# Patient Record
Sex: Male | Born: 1955 | Race: White | Hispanic: No | Marital: Married | Smoking: Former smoker
Health system: Southern US, Community
[De-identification: ages and names within clinical notes are randomized; demographics above are authoritative.]

## PROBLEM LIST (undated history)

## (undated) DIAGNOSIS — Z86010 Personal history of colonic polyps: Principal | ICD-10-CM

## (undated) DIAGNOSIS — B353 Tinea pedis: Secondary | ICD-10-CM

## (undated) DIAGNOSIS — M5117 Intervertebral disc disorders with radiculopathy, lumbosacral region: Secondary | ICD-10-CM

## (undated) DIAGNOSIS — E782 Mixed hyperlipidemia: Secondary | ICD-10-CM

## (undated) DIAGNOSIS — S335XXA Sprain of ligaments of lumbar spine, initial encounter: Secondary | ICD-10-CM

## (undated) DIAGNOSIS — R011 Cardiac murmur, unspecified: Secondary | ICD-10-CM

## (undated) DIAGNOSIS — M5137 Other intervertebral disc degeneration, lumbosacral region: Secondary | ICD-10-CM

## (undated) DIAGNOSIS — M51379 Other intervertebral disc degeneration, lumbosacral region without mention of lumbar back pain or lower extremity pain: Secondary | ICD-10-CM

## (undated) DIAGNOSIS — J301 Allergic rhinitis due to pollen: Secondary | ICD-10-CM

## (undated) DIAGNOSIS — G8929 Other chronic pain: Secondary | ICD-10-CM

## (undated) DIAGNOSIS — G894 Chronic pain syndrome: Secondary | ICD-10-CM

## (undated) DIAGNOSIS — D126 Benign neoplasm of colon, unspecified: Secondary | ICD-10-CM

## (undated) DIAGNOSIS — M5416 Radiculopathy, lumbar region: Secondary | ICD-10-CM

## (undated) DIAGNOSIS — R05 Cough: Secondary | ICD-10-CM

## (undated) DIAGNOSIS — N281 Cyst of kidney, acquired: Secondary | ICD-10-CM

## (undated) DIAGNOSIS — E119 Type 2 diabetes mellitus without complications: Secondary | ICD-10-CM

## (undated) DIAGNOSIS — Z0000 Encounter for general adult medical examination without abnormal findings: Secondary | ICD-10-CM

## (undated) DIAGNOSIS — G4730 Sleep apnea, unspecified: Secondary | ICD-10-CM

## (undated) DIAGNOSIS — I4510 Unspecified right bundle-branch block: Secondary | ICD-10-CM

## (undated) DIAGNOSIS — M79621 Pain in right upper arm: Secondary | ICD-10-CM

## (undated) DIAGNOSIS — K5730 Diverticulosis of large intestine without perforation or abscess without bleeding: Secondary | ICD-10-CM

## (undated) DIAGNOSIS — I517 Cardiomegaly: Secondary | ICD-10-CM

## (undated) HISTORY — DX: Cardiomegaly: I51.7

## (undated) HISTORY — DX: Sleep apnea, unspecified: G47.30

## (undated) HISTORY — DX: Cough: R05

## (undated) HISTORY — DX: Encounter for general adult medical examination without abnormal findings: Z00.00

## (undated) HISTORY — DX: Cardiac murmur, unspecified: R01.1

## (undated) HISTORY — DX: Benign neoplasm of colon, unspecified: D12.6

## (undated) HISTORY — DX: Hypocalcemia: E83.51

## (undated) HISTORY — DX: Unspecified right bundle-branch block: I45.10

## (undated) HISTORY — DX: Pain in right upper arm: M79.621

## (undated) HISTORY — DX: Cyst of kidney, acquired: N28.1

## (undated) HISTORY — DX: Tinea pedis: B35.3

## (undated) HISTORY — DX: Mixed hyperlipidemia: E78.2

## (undated) HISTORY — DX: Sprain of ligaments of lumbar spine, initial encounter: S33.5XXA

## (undated) HISTORY — DX: Allergic rhinitis due to pollen: J30.1

## (undated) HISTORY — DX: Radiculopathy, lumbar region: M54.16

## (undated) HISTORY — DX: Intervertebral disc disorders with radiculopathy, lumbosacral region: M51.17

## (undated) HISTORY — PX: OTHER SURGICAL HISTORY: SHX169

## (undated) HISTORY — DX: Chronic pain syndrome: G89.4

## (undated) HISTORY — DX: Other chronic pain: G89.29

## (undated) HISTORY — DX: Type 2 diabetes mellitus without complications: E11.9

## (undated) HISTORY — DX: Diverticulosis of large intestine without perforation or abscess without bleeding: K57.30

## (undated) HISTORY — DX: Other intervertebral disc degeneration, lumbosacral region without mention of lumbar back pain or lower extremity pain: M51.379

## (undated) HISTORY — DX: Morbid (severe) obesity due to excess calories: E66.01

## (undated) HISTORY — PX: TONSILLECTOMY: SHX5217

## (undated) HISTORY — PX: CHOLECYSTECTOMY: SHX55

## (undated) HISTORY — DX: Personal history of colonic polyps: Z86.010

## (undated) HISTORY — DX: Other intervertebral disc degeneration, lumbosacral region: M51.37

---

## 2006-12-21 ENCOUNTER — Encounter: Payer: Self-pay | Admitting: Family Medicine

## 2006-12-21 DIAGNOSIS — Z860100 Personal history of colon polyps, unspecified: Secondary | ICD-10-CM

## 2006-12-21 HISTORY — DX: Personal history of colon polyps, unspecified: Z86.0100

## 2006-12-21 HISTORY — DX: Personal history of colonic polyps: Z86.010

## 2008-02-02 ENCOUNTER — Encounter
Admission: RE | Admit: 2008-02-02 | Discharge: 2008-02-02 | Payer: Self-pay | Admitting: Physical Medicine and Rehabilitation

## 2008-11-20 LAB — HM COLONOSCOPY

## 2011-01-25 ENCOUNTER — Encounter: Payer: Self-pay | Admitting: Family Medicine

## 2011-01-25 ENCOUNTER — Ambulatory Visit (INDEPENDENT_AMBULATORY_CARE_PROVIDER_SITE_OTHER): Payer: BC Managed Care – PPO | Admitting: Family Medicine

## 2011-01-25 DIAGNOSIS — E669 Obesity, unspecified: Secondary | ICD-10-CM

## 2011-01-25 DIAGNOSIS — G4730 Sleep apnea, unspecified: Secondary | ICD-10-CM

## 2011-01-25 DIAGNOSIS — J301 Allergic rhinitis due to pollen: Secondary | ICD-10-CM | POA: Insufficient documentation

## 2011-01-25 DIAGNOSIS — G8929 Other chronic pain: Secondary | ICD-10-CM | POA: Insufficient documentation

## 2011-01-25 DIAGNOSIS — E782 Mixed hyperlipidemia: Secondary | ICD-10-CM

## 2011-01-25 DIAGNOSIS — I4510 Unspecified right bundle-branch block: Secondary | ICD-10-CM

## 2011-01-25 DIAGNOSIS — E119 Type 2 diabetes mellitus without complications: Secondary | ICD-10-CM

## 2011-01-25 DIAGNOSIS — G4733 Obstructive sleep apnea (adult) (pediatric): Secondary | ICD-10-CM | POA: Insufficient documentation

## 2011-01-25 DIAGNOSIS — K5730 Diverticulosis of large intestine without perforation or abscess without bleeding: Secondary | ICD-10-CM | POA: Insufficient documentation

## 2011-01-25 DIAGNOSIS — S335XXA Sprain of ligaments of lumbar spine, initial encounter: Secondary | ICD-10-CM

## 2011-01-25 DIAGNOSIS — D126 Benign neoplasm of colon, unspecified: Secondary | ICD-10-CM | POA: Insufficient documentation

## 2011-01-25 DIAGNOSIS — R011 Cardiac murmur, unspecified: Secondary | ICD-10-CM

## 2011-01-25 DIAGNOSIS — E1169 Type 2 diabetes mellitus with other specified complication: Secondary | ICD-10-CM | POA: Insufficient documentation

## 2011-01-25 DIAGNOSIS — M5416 Radiculopathy, lumbar region: Secondary | ICD-10-CM

## 2011-01-25 HISTORY — DX: Benign neoplasm of colon, unspecified: D12.6

## 2011-01-25 HISTORY — DX: Mixed hyperlipidemia: E78.2

## 2011-01-25 HISTORY — DX: Morbid (severe) obesity due to excess calories: E66.01

## 2011-01-25 HISTORY — DX: Type 2 diabetes mellitus without complications: E11.9

## 2011-01-25 HISTORY — DX: Sprain of ligaments of lumbar spine, initial encounter: S33.5XXA

## 2011-01-25 HISTORY — DX: Other chronic pain: G89.29

## 2011-01-25 HISTORY — DX: Sleep apnea, unspecified: G47.30

## 2011-01-25 HISTORY — DX: Cardiac murmur, unspecified: R01.1

## 2011-01-25 HISTORY — DX: Diverticulosis of large intestine without perforation or abscess without bleeding: K57.30

## 2011-01-25 HISTORY — DX: Unspecified right bundle-branch block: I45.10

## 2011-01-25 HISTORY — DX: Allergic rhinitis due to pollen: J30.1

## 2011-01-31 ENCOUNTER — Encounter: Payer: Self-pay | Admitting: Family Medicine

## 2011-02-06 ENCOUNTER — Other Ambulatory Visit: Payer: Self-pay | Admitting: Family Medicine

## 2011-02-06 ENCOUNTER — Ambulatory Visit (HOSPITAL_COMMUNITY): Payer: BC Managed Care – PPO | Attending: Internal Medicine

## 2011-02-06 DIAGNOSIS — E785 Hyperlipidemia, unspecified: Secondary | ICD-10-CM | POA: Insufficient documentation

## 2011-02-06 DIAGNOSIS — I253 Aneurysm of heart: Secondary | ICD-10-CM | POA: Insufficient documentation

## 2011-02-06 DIAGNOSIS — R011 Cardiac murmur, unspecified: Secondary | ICD-10-CM

## 2011-02-06 DIAGNOSIS — E119 Type 2 diabetes mellitus without complications: Secondary | ICD-10-CM | POA: Insufficient documentation

## 2011-02-06 DIAGNOSIS — E65 Localized adiposity: Secondary | ICD-10-CM | POA: Insufficient documentation

## 2011-02-07 NOTE — Procedures (Signed)
Summary: [facility]  [facility]onoscopy/[facility]   Imported By: Lanelle Bal 02/02/2011 09:49:47  _____________________________________________________________________  External Attachment:    Type:   Image     Comment:   External Document

## 2011-02-07 NOTE — Assessment & Plan Note (Signed)
Summary: New pt est care/dt [employer]   Vital Signs:  Patient profile:   55 year old male Height:      70.25 inches (178.44 cm) Weight:      308.50 pounds (140.23 kg) BMI:     44.11 O2 Sat:      99 % on Room air Temp:     98.2 degrees F (36.78 degrees C) oral Pulse rate:   79 / minute BP sitting:   130 / 80  (right arm) Cuff size:   large  Vitals Entered By: Josph Macho RMA (January 25, 2011 8:53 AM)  O2 Flow:  Room air CC: Establish new patient/ CF Is Patient Diabetic? Yes   History of Present Illness: Patient is a 55 yo Caucasian male in today for initial new patient appt. He is accompanied by his wife who is a Engineer, civil (consulting) with the [employer] [facility] health system. He reports feeling well today but not having a PMD for many years. He i a long standing diabetic who sees an Endocrinologist regularly and reports good numbers. Tries to minimize simple carbs but does not exercise. He does [place] eye [place] [address] for [place] annual eye check. No podiatrist to date but he denies any lesions or peripheral neuropathy. No polyuria or polydipsia. No CP/palp/SOB/fevers/chills/GI or GU c/o. He had a recent episode of RUQ pain which was bad enough to get him to go to an urgent care facility, ultimately a CT scan was ordered and no concerning lesions were identified and his pain has since subsided. This episode did help him to realize he should have a regular PMD. Patient is known to have sleep apnea but is noncompliant with CPAP as he cannot sleep with this on. He has not had an evaluation in a number of years  Preventive Screening-Counseling & Management  Alcohol-Tobacco     Smoking Status: quit     Year Quit: 11/20/80  Caffeine-Diet-Exercise     Does Patient Exercise: no      Drug Use:  no.    Current Problems (verified): 1)  Colonic Polyps, Benign  (ICD-211.3) 2)  Diverticulosis of Colon  (ICD-562.10) 3)  Undiagnosed Cardiac Murmurs  (ICD-785.2) 4)  Back Strain, Lumbar  (ICD-847.2) 5)  Mixed  Hyperlipidemia  (ICD-272.2) 6)  Morbid Obesity  (ICD-278.01) 7)  Sleep Apnea  (ICD-780.57) 8)  Allergic Rhinitis, Seasonal  (ICD-477.0) 9)  Dm  (ICD-250.00)  Current Medications (verified): 1)  Novolog .... Insulin Pump 2)  Aspirin 325 Mg Tabs (Aspirin) .... Once Daily 3)  Multivitamins  Tabs (Multiple Vitamin) .... Once Daily 4)  Metformin Hcl 1000 Mg Tabs (Metformin Hcl) .... Two Times A Day 5)  Lipitor 40 Mg Tabs (Atorvastatin Calcium) .... Once Daily 6)  Hydrochlorothiazide 25 Mg Tabs (Hydrochlorothiazide) .... Once Daily 7)  Lasix 40 Mg Tabs (Furosemide) .... Every Other Day 8)  Quinapril Hcl 40 Mg Tabs (Quinapril Hcl) .... Once Daily 9)  Flexeril 10 Mg Tabs (Cyclobenzaprine Hcl) .... As Needed For Back Pain 10)  Hydrocodone-Acetaminophen 10-660 Mg Tabs (Hydrocodone-Acetaminophen) .... Q 6 Hrs As Needed For Back Pain  Allergies (verified): No Known Drug Allergies  Past History:  Past Surgical History: Tonsillectomy Cholecystectomy skin biopsy on abdomen, unknown results  Family History: Father: deceased@79 , DM, renal failure, CHF, CAD s/p Mi Mother: deceased in mid 31s, metastatic breast cancer, allergies Siblings:  Brother: deceased@31 , industrial accident, Surveyor, minerals, obese Brother: 64, DM, heart disease s/p CABG Brother: 70, DM diet and oral meds, HTN Sister:  49, DM type I, CAD s/p stents MGM: deceased in 82s, old age, CAD MGF: deceased in 20s, old age PGM: unknown causes PGF: deceased in 31s, unknown causes Children: Daughter: 50, asthma, back disease Daughter: 59, kidney disease in childhood  Social History: Former Smoker over 20 years ago, 3 ppd for greater than 10 years Married, second marriage Dog at home Alcohol use-yes, occasional Drug use-no Regular exercise-no, walking diabetic diet, no other restrictions Wears seat  belts regularly Smoking Status:  quit Drug Use:  no Does Patient Exercise:  no  Review of Systems  The patient denies  anorexia, fever, weight loss, weight gain, vision loss, decreased hearing, hoarseness, chest pain, syncope, dyspnea on exertion, peripheral edema, prolonged cough, headaches, hemoptysis, abdominal pain, melena, hematochezia, severe indigestion/heartburn, hematuria, incontinence, genital sores, muscle weakness, suspicious skin lesions, transient blindness, difficulty walking, unusual weight change, abnormal bleeding, and enlarged lymph nodes.    Physical Exam  General:  Well-developed,well-nourished,in no acute distress; alert,appropriate and cooperative throughout examination Head:  Normocephalic and atraumatic without obvious abnormalities. No apparent alopecia or balding. Eyes:  No corneal or conjunctival inflammation noted. EOMI. Perrla.  Ears:  External ear exam shows no significant lesions or deformities.  Otoscopic examination reveals clear canals, tympanic membranes are intact bilaterally without bulging, retraction, inflammation or discharge. Hearing is grossly normal bilaterally. Nose:  External nasal examination shows no deformity or inflammation. Nasal mucosa are pink and moist without lesions or exudates. Mouth:  Oral mucosa and oropharynx without lesions or exudates.   Neck:  No deformities, masses, or tenderness noted. Lungs:  Normal respiratory effort, chest expands symmetrically. Lungs are clear to auscultation, no crackles or wheezes. Heart:  Normal rate and regular rhythm. S1 and S2 normal without gallop, murmur, click, rub or other extra sounds. Abdomen:  Bowel sounds positive,abdomen soft and non-tender without masses, organomegaly or hernias noted. Msk:  No deformity or scoliosis noted of thoracic or lumbar spine.   Pulses:  R and L carotid,dorsalis pedis and posterior tibial pulses are full and equal bilaterally Extremities:  No clubbing, cyanosis, edema, or deformity noted with normal full range of motion of all joints.   Neurologic:  No cranial nerve deficits noted. Station  and gait are normal. Plantar reflexes are down-going bilaterally. DTRs are symmetrical throughout. Sensory, motor and coordinative functions appear intact. Skin:  Intact without suspicious lesions or rashes Cervical Nodes:  No lymphadenopathy noted Psych:  Cognition and judgment appear intact. Alert and cooperative with normal attention span and concentration. No apparent delusions, illusions, hallucinations   Impression & Recommendations:  Problem # 1:  UNDIAGNOSED CARDIAC MURMURS (ICD-785.2)  Orders: Echo Referral (Echo) Await results  Problem # 2:  MIXED HYPERLIPIDEMIA (ICD-272.2)  His updated medication list for this problem includes:    Lipitor 40 Mg Tabs (Atorvastatin calcium) ..... Once daily Avoid trans fats, start a fish oil or flaxseed oil cap  Problem # 3:  MORBID OBESITY (ICD-278.01) Increase activity as tolerated, minimize by mouth intake, avoid trans fats and simple carbs and use lean proteins  Problem # 4:  DM (ICD-250.00)  His updated medication list for this problem includes:    Aspirin 325 Mg Tabs (Aspirin) ..... Once daily    Metformin Hcl 1000 Mg Tabs (Metformin hcl) .Marland Kitchen..Marland Kitchen Two times a day    Quinapril Hcl 40 Mg Tabs (Quinapril hcl) ..... Once daily  Orders: Echo Referral (Echo) Avoid simple carbs  Problem # 5:  BACK STRAIN, LUMBAR (ICD-847.2) Is well controlled at today's visit, encouraged weight  loss and regular exercise, use meds as needed   Problem # 6:  SLEEP APNEA (ICD-780.57) Patient declines new sleep study for now but does agree to try getting a new mask for his current machine and trying again to use it  Complete Medication List: 1)  Novolog  .... Insulin pump 2)  Aspirin 325 Mg Tabs (Aspirin) .... Once daily 3)  Multivitamins Tabs (Multiple vitamin) .... Once daily 4)  Metformin Hcl 1000 Mg Tabs (Metformin hcl) .... Two times a day 5)  Lipitor 40 Mg Tabs (Atorvastatin calcium) .... Once daily 6)  Hydrochlorothiazide 25 Mg Tabs  (Hydrochlorothiazide) .... Once daily 7)  Lasix 40 Mg Tabs (Furosemide) .... Every other day 8)  Quinapril Hcl 40 Mg Tabs (Quinapril hcl) .... Once daily 9)  Flexeril 10 Mg Tabs (Cyclobenzaprine hcl) .... As needed for back pain 10)  Hydrocodone-acetaminophen 10-660 Mg Tabs (Hydrocodone-acetaminophen) .... Q 6 hrs as needed for back pain  Patient Instructions: 1)  Please schedule a follow-up appointment in 2 to 3 months or sooner as needed 2)  Release of Records Endocrinology, [facility], [place] 3)  Release of Records, [facility] 4)  Release of Records, [facility], colonoscopy results 5)  Release of Records, [facility], CT scan 6)  Start using CPAP again with new mask notify us if ready to go for new sleep study. 7)  Avoid simple carbs, trans fats/partially hydrogenated oils 8)  Release of Records Pulmonology Dr Nathanial Rancher in [facility]   Orders Added: 1)  Echo Referral [Echo] 2)  Est. Patient Level IV [16109]    Preventive Care Screening  Last Tetanus Booster:    Date:  11/20/2008    Results:  Historical   Colonoscopy:    Date:  11/20/2008    Results:  historical

## 2011-03-23 ENCOUNTER — Encounter: Payer: Self-pay | Admitting: Family Medicine

## 2011-03-27 ENCOUNTER — Ambulatory Visit (INDEPENDENT_AMBULATORY_CARE_PROVIDER_SITE_OTHER): Payer: BC Managed Care – PPO | Admitting: Family Medicine

## 2011-03-27 ENCOUNTER — Encounter: Payer: Self-pay | Admitting: Family Medicine

## 2011-03-27 VITALS — BP 120/73 | HR 80 | Temp 98.3°F | Ht 70.25 in | Wt 299.1 lb

## 2011-03-27 DIAGNOSIS — G4730 Sleep apnea, unspecified: Secondary | ICD-10-CM

## 2011-03-27 DIAGNOSIS — E782 Mixed hyperlipidemia: Secondary | ICD-10-CM

## 2011-03-27 DIAGNOSIS — J309 Allergic rhinitis, unspecified: Secondary | ICD-10-CM

## 2011-03-27 DIAGNOSIS — Z9109 Other allergy status, other than to drugs and biological substances: Secondary | ICD-10-CM

## 2011-03-27 DIAGNOSIS — E119 Type 2 diabetes mellitus without complications: Secondary | ICD-10-CM

## 2011-03-27 DIAGNOSIS — J301 Allergic rhinitis due to pollen: Secondary | ICD-10-CM

## 2011-03-27 DIAGNOSIS — E785 Hyperlipidemia, unspecified: Secondary | ICD-10-CM

## 2011-03-27 DIAGNOSIS — R011 Cardiac murmur, unspecified: Secondary | ICD-10-CM

## 2011-03-27 DIAGNOSIS — S335XXA Sprain of ligaments of lumbar spine, initial encounter: Secondary | ICD-10-CM

## 2011-03-27 MED ORDER — CETIRIZINE HCL 10 MG PO TABS: 10.0000 mg | ORAL_TABLET | Freq: Every day | ORAL | Status: DC

## 2011-03-27 MED ORDER — ATORVASTATIN CALCIUM 40 MG PO TABS: 40.0000 mg | ORAL_TABLET | Freq: Every day | ORAL | Status: DC

## 2011-03-27 MED ORDER — FLUTICASONE PROPIONATE 50 MCG/ACT NA SUSP: 2.0000 | Freq: Every day | NASAL | Status: DC

## 2011-03-27 NOTE — Patient Instructions (Signed)
Sleep Apnea     Sleep apnea is a common disorder. The main problem of this disorder is excessive daytime sleepiness and compromised quality of life. This includes social and emotional problems. There are two types of sleep apnea.    Obstructive sleep apnea is when breathing stops due to a blocked airway.        Central sleep apnea is a malfunction of the brain's normal signal to breathe.      SYMPTOMS OF SLEEP APNEA MAY INCLUDE:    Restless sleep.    Falling asleep while driving and/or during the day.   Loss of energy.   Irritability.   Mood or behavior changes.  Loud, heavy snoring.    Morning headaches.   Trouble concentrating.   Forgetfulness.   Anxiety or depression.   Decreased interest in sex.      Not all people with sleep apnea have all of these symptoms. However, people who have a few of these symptoms should visit their caregiver for an evaluation. Problems related to untreated sleep apnea include:   High blood pressure (hypertension).    Coronary artery disease.   Impotence.  Cognitive dysfunction.    Memory loss.       TREATMENT   For mild cases, treatment may include avoiding sleeping on one's back.    For people with nasal congestion, a decongestant may be prescribed.    Patients with obstructive and central apnea should avoid depressants. This includes alcohol, sedatives and narcotics. Weight loss and diet control are encouraged for overweight patients.    Many serious cases of obstructive sleep apnea can be relieved by a treatment called nasal continuous positive airway pressure (nasal CPAP). Nasal CPAP uses a mask-like device and pump that work together to keep the airway open. The pump delivers air pressure during each breath. Surgery may help some patients by stopping or reducing the narrowing of the airway due to anatomical defects.     PROGNOSIS   Removing the obstruction usually reverses hypertension and cardiac problems. Untreated, sleep apnea sufferers have a tendency to fall asleep during the day. This is can result in serious accident or loss of ones job.     RESEARCH BEING DONE  Sleep apnea is currently one of the most active areas of sleep research.      Document Released: 10/27/2002  Document Re-Released: 08/15/2008  ExitCare Patient Information 2011 [employer].

## 2011-03-27 NOTE — Progress Notes (Signed)
Jake Ortiz [id number] Mar 05, 1956 03/27/2011      Progress Note-Follow Up  Subjective  Chief Complaint  Chief Complaint  Patient presents with  . Follow-up    3 month follow up    HPI  Patient is a 55 year old Caucasian male in today for followup on multiple medical problems. Overall he feels he is doing well. His back pain is improved. He reports his sugars have been well controlled. He does follow if endocrinology and they do his annual blood work. He has an appointment in the near future and agrees to have the results forwarded to [place]. He knows they follow his cholesterol as well as his diabetes. He has recently obtained a new mask and is using his CPAP machine again. His concern is that he has significantly increased nasal congestion and difficulty breathing when he wears his mask. He is tried Claritin-D without any great benefit. He denies fevers, chills, green rhinorrhea, sore throat, chest pain, palpitations, shortness of breath, GI or GU complaints at today's visit. He has cut back on his portion sizes and is avoiding simple carbs and is pleased with his weight loss since his last visit.  Past Medical History  Diagnosis Date  . Diabetes mellitus   . Undiagnosed cardiac murmurs 01/25/2011  . SLEEP APNEA 01/25/2011  . Morbid obesity 01/25/2011  . Mixed hyperlipidemia 01/25/2011  . DM 01/25/2011  . DIVERTICULOSIS OF COLON 01/25/2011  . COLONIC POLYPS, BENIGN 01/25/2011  . BACK STRAIN, LUMBAR 01/25/2011  . ALLERGIC RHINITIS, SEASONAL 01/25/2011    Past Surgical History  Procedure Date  . Tonsillectomy   . Cholecystectomy   . Skin biopsy on abdomen     unknown results    Family History  Problem Relation Age of Onset  . Cancer Mother     metastatic breast ca  . Allergies Mother   . Diabetes Father   . Other Father     renal failure/ CHF  . Coronary artery disease Father     s/p MI  . Diabetes Sister     Type 1  . Coronary artery disease Sister     s/p stents  . Obesity  Brother   . Other Brother     Surveyor, minerals  . Asthma Daughter   . Other Daughter     back disease  . Coronary artery disease Maternal Grandmother   . Diabetes Brother   . Heart disease Brother     s/p CABG  . Diabetes Brother     diet and oral meds  . Hypertension Brother   . Kidney disease Daughter     in childhood    History   Social History  . Marital Status: Married    Spouse Name: N/A    Number of Children: N/A  . Years of Education: N/A   Occupational History  . Not on file.   Social History Main Topics  . Smoking status: Former Smoker -- 3.0 packs/day for 10 years    Quit date: 11/20/1990  . Smokeless tobacco: Never Used  . Alcohol Use: Yes     occasional  . Drug Use: No  . Sexually Active: Not on file   Other Topics Concern  . Not on file   Social History Narrative  . No narrative on file    Current Outpatient Prescriptions on File Prior to Visit  Medication Sig Dispense Refill  . aspirin 325 MG tablet Take 325 mg by mouth daily.        . cyclobenzaprine (  FLEXERIL) 10 MG tablet Take 10 mg by mouth as needed. For back pain       . furosemide (LASIX) 40 MG tablet Take 40 mg by mouth every other day.        . hydrochlorothiazide 25 MG tablet Take 25 mg by mouth daily.        . Hydrocodone-Acetaminophen 10-660 MG TABS Take by mouth every 6 (six) hours as needed.        . insulin aspart (NOVOLOG) 100 UNIT/ML injection Inject into the skin. Insulin pump       . metFORMIN (GLUCOPHAGE) 1000 MG tablet Take 1,000 mg by mouth 2 (two) times daily.        . multivitamin (THERAGRAN) per tablet Take 1 tablet by mouth daily.        . quinapril (ACCUPRIL) 40 MG tablet Take 40 mg by mouth daily.        Marland Kitchen DISCONTD: atorvastatin (LIPITOR) 40 MG tablet Take 40 mg by mouth daily.          No Known Allergies  Review of Systems  Review of Systems  Constitutional: Negative for fever and malaise/fatigue.  HENT: Positive for congestion. Negative for ear pain,  nosebleeds and sore throat.   Eyes: Negative for discharge.  Respiratory: Negative for shortness of breath.   Cardiovascular: Negative for chest pain, palpitations and leg swelling.  Gastrointestinal: Negative for nausea, abdominal pain and diarrhea.  Genitourinary: Negative for dysuria.  Musculoskeletal: Negative for falls.  Skin: Negative for rash.  Neurological: Negative for loss of consciousness and headaches.  Endo/Heme/Allergies: Negative for polydipsia.  Psychiatric/Behavioral: Negative for depression and suicidal ideas. The patient is not nervous/anxious and does not have insomnia.     Objective  BP 120/73  Pulse 80  Temp(Src) 98.3 F (36.8 C) (Oral)  Ht 5' 10.25" (1.784 m)  Wt 299 lb 1.9 oz (135.68 kg)  BMI 42.61 kg/m2  SpO2 96%  Physical Exam  Physical Exam  Constitutional: He is oriented to person, place, and time and well-developed, well-nourished, and in no distress. No distress.       Obese  HENT:  Head: Normocephalic and atraumatic.  Eyes: Conjunctivae are normal.  Neck: Neck supple. No thyromegaly present.  Cardiovascular: Normal rate, regular rhythm and normal heart sounds.   No murmur heard. Pulmonary/Chest: Effort normal and breath sounds normal. No respiratory distress.  Abdominal: He exhibits no distension and no mass. There is no tenderness.  Musculoskeletal: He exhibits no edema.  Neurological: He is alert and oriented to person, place, and time.  Skin: Skin is warm.  Psychiatric: Memory, affect and judgment normal.       Assessment & Plan  UNDIAGNOSED CARDIAC MURMURS Recent Echo reviewed with patient today, does show trivial MR but no other valvular abnomalities, LA dilation and LVH both mild were noted and are consistent with poorly treated Sleep Apnea. Need to use CPAP reinforced today  SLEEP APNEA Patient has obtained a new mask and is using his CPAP regularly again, his biggest c/o is his nose stops up more when he puts on his mask,  secondary to his allergies, we will address this with antihistamines and nasal steroids.  MORBID OBESITY Has lost almost 9# since his last visit by cutting back on portion sizes and making smart choices, encouraged him to continue current efforts avoiding simple carbs and trans fats.  MIXED HYPERLIPIDEMIA Patient follows with and has his blood work drawn by his encocrinologist. He agrees to have his labs to  be drawn in the near future forwarded to our office so we can document well to provide him with medication refills  DM Patient reports recent good control of his sugars, no changes he will have endocrine forward lab results  BACK STRAIN, LUMBAR No c/o today, encouraged to continue with weight loss attempts  ALLERGIC RHINITIS, SEASONAL He has tried Claritin without great relief, he is encouraged to try switching to Cetirizine and he is given a sample of Nasonex 2 sprays to each nostril daily, he is also given an rx for Fluticasone to try after that 2 sprays each nostril daily. He may call and request a refill on either one he finds most helpful.

## 2011-03-27 NOTE — Assessment & Plan Note (Signed)
Has lost almost 9# since his last visit by cutting back on portion sizes and making smart choices, encouraged him to continue current efforts avoiding simple carbs and trans fats.

## 2011-03-27 NOTE — Assessment & Plan Note (Signed)
Recent Echo reviewed with patient today, does show trivial MR but no other valvular abnomalities, LA dilation and LVH both mild were noted and are consistent with poorly treated Sleep Apnea. Need to use CPAP reinforced today

## 2011-03-27 NOTE — Assessment & Plan Note (Signed)
Patient follows with and has his blood work drawn by his encocrinologist. He agrees to have his labs to be drawn in the near future forwarded to our office so we can document well to provide him with medication refills

## 2011-03-27 NOTE — Assessment & Plan Note (Signed)
No c/o today, encouraged to continue with weight loss attempts

## 2011-03-27 NOTE — Assessment & Plan Note (Signed)
He has tried Claritin without great relief, he is encouraged to try switching to Cetirizine and he is given a sample of Nasonex 2 sprays to each nostril daily, he is also given an rx for Fluticasone to try after that 2 sprays each nostril daily. He may call and request a refill on either one he finds most helpful.

## 2011-03-27 NOTE — Assessment & Plan Note (Signed)
Patient has obtained a new mask and is using his CPAP regularly again, his biggest c/o is his nose stops up more when he puts on his mask, secondary to his allergies, we will address this with antihistamines and nasal steroids.

## 2011-03-27 NOTE — Assessment & Plan Note (Signed)
Patient reports recent good control of his sugars, no changes he will have endocrine forward lab results

## 2012-03-04 ENCOUNTER — Encounter: Payer: Self-pay | Admitting: Family Medicine

## 2012-03-04 ENCOUNTER — Ambulatory Visit (INDEPENDENT_AMBULATORY_CARE_PROVIDER_SITE_OTHER): Payer: BC Managed Care – PPO | Admitting: Family Medicine

## 2012-03-04 DIAGNOSIS — E782 Mixed hyperlipidemia: Secondary | ICD-10-CM

## 2012-03-04 DIAGNOSIS — Z0000 Encounter for general adult medical examination without abnormal findings: Secondary | ICD-10-CM

## 2012-03-04 DIAGNOSIS — S335XXA Sprain of ligaments of lumbar spine, initial encounter: Secondary | ICD-10-CM

## 2012-03-04 DIAGNOSIS — E119 Type 2 diabetes mellitus without complications: Secondary | ICD-10-CM

## 2012-03-04 DIAGNOSIS — J301 Allergic rhinitis due to pollen: Secondary | ICD-10-CM

## 2012-03-04 DIAGNOSIS — D126 Benign neoplasm of colon, unspecified: Secondary | ICD-10-CM

## 2012-03-04 HISTORY — DX: Hypocalcemia: E83.51

## 2012-03-04 HISTORY — DX: Encounter for general adult medical examination without abnormal findings: Z00.00

## 2012-03-04 LAB — RENAL FUNCTION PANEL
BUN: 10 mg/dL (ref 6–23)
CO2: 26 mEq/L (ref 19–32)
Calcium: 9 mg/dL (ref 8.4–10.5)
Creatinine, Ser: 0.8 mg/dL (ref 0.4–1.5)
GFR: 112.11 mL/min (ref >60.00)
Glucose, Bld: 195 mg/dL — ABNORMAL HIGH (ref 70–99)
Sodium: 137 mEq/L (ref 135–145)

## 2012-03-04 NOTE — Assessment & Plan Note (Signed)
Very little trouble with his back since his weight loss, may continue pain meds prn

## 2012-03-04 NOTE — Assessment & Plan Note (Signed)
Has done a great job of decreasing his weight by decreasing his po intake and avoiding simple carbs and processed foods as well as red meat. Encouraged to increase exercise.

## 2012-03-04 NOTE — Assessment & Plan Note (Signed)
Follows with endocrinology, he has made some great changes and is basal insulin via his pump has been dropped from 150 to less than 90, he is encouraged to continue the same.

## 2012-03-04 NOTE — Assessment & Plan Note (Signed)
Encouraged him to continue heart healthy diet but also to add in regular exercise now that his weight is down

## 2012-03-04 NOTE — Assessment & Plan Note (Signed)
Recent flare in symptoms, has been using Zyrtec as needed with good results.

## 2012-03-04 NOTE — Assessment & Plan Note (Signed)
Had stopped his Lipitor 40 mg due to his weight loss and now is on Lipitor at the direction of endocrinologist

## 2012-03-04 NOTE — Progress Notes (Signed)
Patient ID: Jake Ortiz, male   DOB: 09/05/1956, 56 y.o.   MRN: [id number] Jake Ortiz [id number] 02-Mar-1956 03/04/2012      Progress Note New Patient  Subjective  Chief Complaint  Chief Complaint  Patient presents with  . Annual Exam    physical    HPI  56 year old Caucasian male who is in today for annual exam. He has made great changes since his last visit he changed his diet funneling. Is eating much less and eating the right tooth. He avoids most carbs in all simple carbs. Eats only complex carbs, lots of fruits thresholds and intermittent fish and poultry. He has not started exercising he reports overall he feels much better with the weight loss. He stopped his Lipitor and then the endocrinologist just restarted it but down to 10 from 40 mg. His basal insulin has dropped from 150 to 90. He reports his back as well as his knees feel much better. He did recent have a chest cold with little symptoms have resolved. Reports his recent hemoglobin A1c was 5.7. He denies any recent illness, fevers, chills, chest pain, palpitations, shortness of breath, GI or GU concerns at this time  Past Medical History  Diagnosis Date  . Diabetes mellitus   . Undiagnosed cardiac murmurs 01/25/2011  . SLEEP APNEA 01/25/2011  . Morbid obesity 01/25/2011  . Mixed hyperlipidemia 01/25/2011  . DM 01/25/2011  . DIVERTICULOSIS OF COLON 01/25/2011  . COLONIC POLYPS, BENIGN 01/25/2011  . BACK STRAIN, LUMBAR 01/25/2011  . ALLERGIC RHINITIS, SEASONAL 01/25/2011  . Hypocalcemia 03/04/2012  . Preventative health care 03/04/2012    Past Surgical History  Procedure Date  . Tonsillectomy   . Cholecystectomy   . Skin biopsy on abdomen     unknown results    Family History  Problem Relation Age of Onset  . Cancer Mother     metastatic breast ca  . Allergies Mother   . Diabetes Father   . Other Father     renal failure/ CHF  . Coronary artery disease Father     s/p MI  . Diabetes Sister     Type 1  .  Coronary artery disease Sister     s/p stents  . Obesity Brother   . Other Brother     Surveyor, minerals  . Asthma Daughter   . Other Daughter     back disease  . Coronary artery disease Maternal Grandmother   . Diabetes Brother   . Heart disease Brother     s/p CABG  . Diabetes Brother     diet and oral meds  . Hypertension Brother   . Kidney disease Daughter     in childhood    History   Social History  . Marital Status: Married    Spouse Name: N/A    Number of Children: N/A  . Years of Education: N/A   Occupational History  . Not on file.   Social History Main Topics  . Smoking status: Former Smoker -- 3.0 packs/day for 10 years    Quit date: 11/20/1990  . Smokeless tobacco: Never Used  . Alcohol Use: Yes     occasional  . Drug Use: No  . Sexually Active: Not on file   Other Topics Concern  . Not on file   Social History Narrative  . No narrative on file    Current Outpatient Prescriptions on File Prior to Visit  Medication Sig Dispense Refill  . aspirin 325 MG tablet Take 325  mg by mouth daily.        . cetirizine (ZYRTEC) 10 MG tablet Take 1 tablet (10 mg total) by mouth daily.  30 tablet  11  . cyclobenzaprine (FLEXERIL) 10 MG tablet Take 10 mg by mouth as needed. For back pain       . Hydrocodone-Acetaminophen 10-660 MG TABS Take by mouth every 6 (six) hours as needed.        . insulin aspart (NOVOLOG) 100 UNIT/ML injection Inject into the skin. Insulin pump       . metFORMIN (GLUCOPHAGE) 1000 MG tablet Take 1,000 mg by mouth 2 (two) times daily.        . multivitamin (THERAGRAN) per tablet Take 1 tablet by mouth daily.        . quinapril (ACCUPRIL) 40 MG tablet Take 40 mg by mouth daily.          No Known Allergies  Review of Systems  Review of Systems  Constitutional: Negative for fever, chills and malaise/fatigue.  HENT: Negative for hearing loss, nosebleeds and congestion.   Eyes: Negative for discharge.  Respiratory: Negative for cough,  sputum production, shortness of breath and wheezing.   Cardiovascular: Negative for chest pain, palpitations and leg swelling.  Gastrointestinal: Negative for heartburn, nausea, vomiting, abdominal pain, diarrhea, constipation and blood in stool.  Genitourinary: Negative for dysuria, urgency, frequency and hematuria.  Musculoskeletal: Negative for myalgias, back pain and falls.  Skin: Negative for rash.  Neurological: Negative for dizziness, tremors, sensory change, focal weakness, loss of consciousness, weakness and headaches.  Endo/Heme/Allergies: Negative for polydipsia. Does not bruise/bleed easily.  Psychiatric/Behavioral: Negative for depression and suicidal ideas. The patient is not nervous/anxious and does not have insomnia.     Objective  BP 135/81  Pulse 72  Temp(Src) 97.9 F (36.6 C) (Temporal)  Ht 5' 10.25" (1.784 m)  Wt 267 lb 1.9 oz (121.165 kg)  BMI 38.06 kg/m2  SpO2 99%  Physical Exam  Physical Exam  Constitutional: He is oriented to person, place, and time and well-developed, well-nourished, and in no distress. No distress.  HENT:  Head: Normocephalic and atraumatic.  Eyes: Conjunctivae are normal.  Neck: Neck supple. No thyromegaly present.  Cardiovascular: Normal rate, regular rhythm and normal heart sounds.   No murmur heard. Pulmonary/Chest: Effort normal and breath sounds normal. No respiratory distress.  Abdominal: Soft. Bowel sounds are normal. He exhibits no distension and no mass. There is no tenderness.  Musculoskeletal: He exhibits no edema.  Neurological: He is alert and oriented to person, place, and time.  Skin: Skin is warm. No rash noted.  Psychiatric: Memory, affect and judgment normal.       Assessment & Plan  MORBID OBESITY Has done a great job of decreasing his weight by decreasing his po intake and avoiding simple carbs and processed foods as well as red meat. Encouraged to increase exercise.  MIXED HYPERLIPIDEMIA Had stopped his  Lipitor 40 mg due to his weight loss and now is on Lipitor at the direction of endocrinologist  Hypocalcemia Repeat renal and check a vitamin d level today, is taking Vitamin D 1000IU daily at this time  DM Follows with endocrinology, he has made some great changes and is basal insulin via his pump has been dropped from 150 to less than 90, he is encouraged to continue the same.  COLONIC POLYPS, BENIGN He reports this summer will be 5 years since his last so he will call to make the appt. He reports he  believes he would like to switch his care to our system, he will let us know when he calls for referral  ALLERGIC RHINITIS, SEASONAL Recent flare in symptoms, has been using Zyrtec as needed with good results.  BACK STRAIN, LUMBAR Very little trouble with his back since his weight loss, may continue pain meds prn  Preventative health care Encouraged him to continue heart healthy diet but also to add in regular exercise now that his weight is down

## 2012-03-04 NOTE — Assessment & Plan Note (Addendum)
Repeat renal and check a vitamin d level today, is taking Vitamin D 1000IU daily at this time

## 2012-03-04 NOTE — Patient Instructions (Signed)
Preventive Care for Adults, Male A healthy lifestyle and preventative care can promote health and wellness. Preventative health guidelines for men include the following key practices:  A routine yearly physical is a good way to check with your caregiver about your health and preventative screening. It is a chance to share any concerns and updates on your health, and to receive a thorough exam.   Visit your dentist for a routine exam and preventative care every 6 months. Brush your teeth twice a day and floss once a day. Good oral hygiene prevents tooth decay and gum disease.   The frequency of eye exams is based on your age, health, family medical history, use of contact lenses, and other factors. Follow your caregiver's recommendations for frequency of eye exams.   Eat a healthy diet. Foods like vegetables, fruits, whole grains, low-fat dairy products, and lean protein foods contain the nutrients you need without too many calories. Decrease your intake of foods high in solid fats, added sugars, and salt. Eat the right amount of calories for you.Get information about a proper diet from your caregiver, if necessary.   Regular physical exercise is one of the most important things you can do for your health. Most adults should get at least 150 minutes of moderate-intensity exercise (any activity that increases your heart rate and causes you to sweat) each week. In addition, most adults need muscle-strengthening exercises on 2 or more days a week.   Maintain a healthy weight. The body mass index (BMI) is a screening tool to identify possible weight problems. It provides an estimate of body fat based on height and weight. Your caregiver can help determine your BMI, and can help you achieve or maintain a healthy weight.For adults 20 years and older:   A BMI below 18.5 is considered underweight.   A BMI of 18.5 to 24.9 is normal.   A BMI of 25 to 29.9 is considered overweight.   A BMI of 30 and above  is considered obese.   Maintain normal blood lipids and cholesterol levels by exercising and minimizing your intake of saturated fat. Eat a balanced diet with plenty of fruit and vegetables. Blood tests for lipids and cholesterol should begin at age 20 and be repeated every 5 years. If your lipid or cholesterol levels are high, you are over 50, or you are a high risk for heart disease, you may need your cholesterol levels checked more frequently.Ongoing high lipid and cholesterol levels should be treated with medicines if diet and exercise are not effective.   If you smoke, find out from your caregiver how to quit. If you do not use tobacco, do not start.   If you choose to drink alcohol, do not exceed 2 drinks per day. One drink is considered to be 12 ounces (355 mL) of beer, 5 ounces (148 mL) of wine, or 1.5 ounces (44 mL) of liquor.   Avoid use of street drugs. Do not share needles with anyone. Ask for help if you need support or instructions about stopping the use of drugs.   High blood pressure causes heart disease and increases the risk of stroke. Your blood pressure should be checked at least every 1 to 2 years. Ongoing high blood pressure should be treated with medicines, if weight loss and exercise are not effective.   If you are 45 to 58 years old, ask your caregiver if you should take aspirin to prevent heart disease.   Diabetes screening involves taking a blood   sample to check your fasting blood sugar level. This should be done once every 3 years, after age 45, if you are within normal weight and without risk factors for diabetes. Testing should be considered at a younger age or be carried out more frequently if you are overweight and have at least 1 risk factor for diabetes.   Colorectal cancer can be detected and often prevented. Most routine colorectal cancer screening begins at the age of 50 and continues through age 75. However, your caregiver may recommend screening at an earlier  age if you have risk factors for colon cancer. On a yearly basis, your caregiver may provide home test kits to check for hidden blood in the stool. Use of a small camera at the end of a tube, to directly examine the colon (sigmoidoscopy or colonoscopy), can detect the earliest forms of colorectal cancer. Talk to your caregiver about this at age 50, when routine screening begins. Direct examination of the colon should be repeated every 5 to 10 years through age 75, unless early forms of pre-cancerous polyps or small growths are found.   Hepatitis C blood testing is recommended for all people born from 1945 through 1965 and any individual with known risks for hepatitis C.   Practice safe sex. Use condoms and avoid high-risk sexual practices to reduce the spread of sexually transmitted infections (STIs). STIs include gonorrhea, chlamydia, syphilis, trichomonas, herpes, HPV, and human immunodeficiency virus (HIV). Herpes, HIV, and HPV are viral illnesses that have no cure. They can result in disability, cancer, and death.   A one-time screening for abdominal aortic aneurysm (AAA) and surgical repair of large AAAs by sound wave imaging (ultrasonography) is recommended for ages 65 to 75 years who are current or former smokers.   Healthy men should no longer receive prostate-specific antigen (PSA) blood tests as part of routine cancer screening. Consult with your caregiver about prostate cancer screening.   Testicular cancer screening is not recommended for adult males who have no symptoms. Screening includes self-exam, caregiver exam, and other screening tests. Consult with your caregiver about any symptoms you have or any concerns you have about testicular cancer.   Use sunscreen with skin protection factor (SPF) of 30 or more. Apply sunscreen liberally and repeatedly throughout the day. You should seek shade when your shadow is shorter than you. Protect yourself by wearing long sleeves, pants, a  wide-brimmed hat, and sunglasses year round, whenever you are outdoors.   Once a month, do a whole body skin exam, using a mirror to look at the skin on your back. Notify your caregiver of new moles, moles that have irregular borders, moles that are larger than a pencil eraser, or moles that have changed in shape or color.   Stay current with required immunizations.   Influenza. You need a dose every fall (or winter). The composition of the flu vaccine changes each year, so being vaccinated once is not enough.   Pneumococcal polysaccharide. You need 1 to 2 doses if you smoke cigarettes or if you have certain chronic medical conditions. You need 1 dose at age 65 (or older) if you have never been vaccinated.   Tetanus, diphtheria, pertussis (Tdap, Td). Get 1 dose of Tdap vaccine if you are younger than age 65 years, are over 65 and have contact with an infant, are a healthcare worker, or simply want to be protected from whooping cough. After that, you need a Td booster dose every 10 years. Consult your caregiver if   you have not had at least 3 tetanus and diphtheria-containing shots sometime in your life or have a deep or dirty wound.   HPV. This vaccine is recommended for males 13 through 58 years of age. This vaccine may be given to men 22 through 58 years of age who have not completed the 3 dose series. It is recommended for men through age 26 who have sex with men or whose immune system is weakened because of HIV infection, other illness, or medications. The vaccine is given in 3 doses over 6 months.   Measles, mumps, rubella (MMR). You need at least 1 dose of MMR if you were born in 1957 or later. You may also need a 2nd dose.   Meningococcal. If you are age 19 to 21 years and a first-year college student living in a residence hall, or have one of several medical conditions, you need to get vaccinated against meningococcal disease. You may also need additional booster doses.   Zoster (shingles).  If you are age 60 years or older, you should get this vaccine.   Varicella (chickenpox). If you have never had chickenpox or you were vaccinated but received only 1 dose, talk to your caregiver to find out if you need this vaccine.   Hepatitis A. You need this vaccine if you have a specific risk factor for hepatitis A virus infection, or you simply wish to be protected from this disease. The vaccine is usually given as 2 doses, 6 to 18 months apart.   Hepatitis B. You need this vaccine if you have a specific risk factor for hepatitis B virus infection or you simply wish to be protected from this disease. The vaccine is given in 3 doses, usually over 6 months.  Preventative Service / Frequency Ages 19 to 39  Blood pressure check.** / Every 1 to 2 years.   Lipid and cholesterol check.** / Every 5 years beginning at age 20.   Hepatitis C blood test.** / For any individual with known risks for hepatitis C.   Skin self-exam. / Monthly.   Influenza immunization.** / Every year.   Pneumococcal polysaccharide immunization.** / 1 to 2 doses if you smoke cigarettes or if you have certain chronic medical conditions.   Tetanus, diphtheria, pertussis (Tdap,Td) immunization. / A one-time dose of Tdap vaccine. After that, you need a Td booster dose every 10 years.   HPV immunization. / 3 doses over 6 months, if 26 and younger.   Measles, mumps, rubella (MMR) immunization. / You need at least 1 dose of MMR if you were born in 1957 or later. You may also need a 2nd dose.   Meningococcal immunization. / 1 dose if you are age 19 to 21 years and a first-year college student living in a residence hall, or have one of several medical conditions, you need to get vaccinated against meningococcal disease. You may also need additional booster doses.   Varicella immunization.** / Consult your caregiver.   Hepatitis A immunization.** / Consult your caregiver. 2 doses, 6 to 18 months apart.   Hepatitis B  immunization.** / Consult your caregiver. 3 doses usually over 6 months.  Ages 40 to 64  Blood pressure check.** / Every 1 to 2 years.   Lipid and cholesterol check.** / Every 5 years beginning at age 20.   Fecal occult blood test (FOBT) of stool. / Every year beginning at age 50 and continuing until age 75. You may not have to do this test if   you get colonoscopy every 10 years.   Flexible sigmoidoscopy** or colonoscopy.** / Every 5 years for a flexible sigmoidoscopy or every 10 years for a colonoscopy beginning at age 50 and continuing until age 75.   Hepatitis C blood test.** / For all people born from 1945 through 1965 and any individual with known risks for hepatitis C.   Skin self-exam. / Monthly.   Influenza immunization.** / Every year.   Pneumococcal polysaccharide immunization.** / 1 to 2 doses if you smoke cigarettes or if you have certain chronic medical conditions.   Tetanus, diphtheria, pertussis (Tdap/Td) immunization.** / A one-time dose of Tdap vaccine. After that, you need a Td booster dose every 10 years.   Measles, mumps, rubella (MMR) immunization. / You need at least 1 dose of MMR if you were born in 1957 or later. You may also need a 2nd dose.   Varicella immunization.**/ Consult your caregiver.   Meningococcal immunization.** / Consult your caregiver.   Hepatitis A immunization.** / Consult your caregiver. 2 doses, 6 to 18 months apart.   Hepatitis B immunization.** / Consult your caregiver. 3 doses, usually over 6 months.  Ages 65 and over  Blood pressure check.** / Every 1 to 2 years.   Lipid and cholesterol check.**/ Every 5 years beginning at age 20.   Fecal occult blood test (FOBT) of stool. / Every year beginning at age 50 and continuing until age 75. You may not have to do this test if you get colonoscopy every 10 years.   Flexible sigmoidoscopy** or colonoscopy.** / Every 5 years for a flexible sigmoidoscopy or every 10 years for a colonoscopy  beginning at age 50 and continuing until age 75.   Hepatitis C blood test.** / For all people born from 1945 through 1965 and any individual with known risks for hepatitis C.   Abdominal aortic aneurysm (AAA) screening.** / A one-time screening for ages 65 to 75 years who are current or former smokers.   Skin self-exam. / Monthly.   Influenza immunization.** / Every year.   Pneumococcal polysaccharide immunization.** / 1 dose at age 65 (or older) if you have never been vaccinated.   Tetanus, diphtheria, pertussis (Tdap, Td) immunization. / A one-time dose of Tdap vaccine if you are over 65 and have contact with an infant, are a healthcare worker, or simply want to be protected from whooping cough. After that, you need a Td booster dose every 10 years.   Varicella immunization. ** / Consult your caregiver.   Meningococcal immunization.** / Consult your caregiver.   Hepatitis A immunization. ** / Consult your caregiver. 2 doses, 6 to 18 months apart.   Hepatitis B immunization.** / Check with your caregiver. 3 doses, usually over 6 months.  **Family history and personal history of risk and conditions may change your caregiver's recommendations. Document Released: 01/02/2002 Document Revised: 10/26/2011 Document Reviewed: 04/03/2011 [employer] Patient Information 2012 [employer]. 

## 2012-03-04 NOTE — Assessment & Plan Note (Signed)
He reports this summer will be 5 years since his last so he will call to make the appt. He reports he believes he would like to switch his care to our system, he will let us know when he calls for referral

## 2012-03-07 LAB — VITAMIN D 1,25 DIHYDROXY
Vitamin D 1, 25 (OH)2 Total: 58 pg/mL (ref 18–72)
Vitamin D2 1, 25 (OH)2: <8 pg/mL

## 2013-01-04 ENCOUNTER — Other Ambulatory Visit: Payer: Self-pay

## 2013-03-13 ENCOUNTER — Encounter: Payer: Self-pay | Admitting: Family Medicine

## 2013-05-02 ENCOUNTER — Other Ambulatory Visit: Payer: Self-pay | Admitting: Physical Medicine and Rehabilitation

## 2013-05-02 DIAGNOSIS — R29898 Other symptoms and signs involving the musculoskeletal system: Secondary | ICD-10-CM

## 2013-05-13 ENCOUNTER — Ambulatory Visit
Admission: RE | Admit: 2013-05-13 | Discharge: 2013-05-13 | Disposition: A | Discharge status: Home / Self Care | Payer: BC Managed Care – PPO | Source: Ambulatory Visit | Attending: Physical Medicine and Rehabilitation | Admitting: Physical Medicine and Rehabilitation

## 2013-05-13 DIAGNOSIS — R29898 Other symptoms and signs involving the musculoskeletal system: Secondary | ICD-10-CM

## 2013-05-15 ENCOUNTER — Ambulatory Visit (INDEPENDENT_AMBULATORY_CARE_PROVIDER_SITE_OTHER): Payer: BC Managed Care – PPO

## 2013-05-15 ENCOUNTER — Ambulatory Visit (INDEPENDENT_AMBULATORY_CARE_PROVIDER_SITE_OTHER): Payer: BC Managed Care – PPO | Admitting: Neurology

## 2013-05-15 DIAGNOSIS — R209 Unspecified disturbances of skin sensation: Secondary | ICD-10-CM

## 2013-05-15 DIAGNOSIS — M545 Low back pain, unspecified: Secondary | ICD-10-CM

## 2013-05-15 DIAGNOSIS — R200 Anesthesia of skin: Secondary | ICD-10-CM

## 2013-05-15 NOTE — Procedures (Signed)
History of present illness: 57 year old gentleman with long-standing history of insulin-dependent diabetes, presenting with bilateral feet paresthesia  Nerve conduction study: Bilateral peroneal sensory responses were normal. Bilateral tibial, peroneal to EDB motor responses were normal.  Bilateral tibial reflexes were normal and symmetric  Electromyography: Selected needle examination was performed lower extremity muscles, right lumbosacral paraspinal muscles,  Needle examination of right tibialis anterior, tibialis posterior, peroneal longus, vastus lateralis, biceps femoris short head was normal  In conclusion:  This is a normal study, there was no electrodiagnostic evidence of large fiber peripheral neuropathy, or right lumbosacral radiculopathy

## 2013-06-27 ENCOUNTER — Ambulatory Visit (INDEPENDENT_AMBULATORY_CARE_PROVIDER_SITE_OTHER): Payer: BC Managed Care – PPO | Admitting: Family Medicine

## 2013-06-27 ENCOUNTER — Telehealth: Payer: Self-pay | Admitting: Family Medicine

## 2013-06-27 ENCOUNTER — Encounter: Payer: Self-pay | Admitting: Family Medicine

## 2013-06-27 VITALS — BP 136/82 | HR 90 | Temp 98.1°F | Ht 70.25 in | Wt 298.0 lb

## 2013-06-27 DIAGNOSIS — R011 Cardiac murmur, unspecified: Secondary | ICD-10-CM

## 2013-06-27 DIAGNOSIS — Z1211 Encounter for screening for malignant neoplasm of colon: Secondary | ICD-10-CM

## 2013-06-27 DIAGNOSIS — D126 Benign neoplasm of colon, unspecified: Secondary | ICD-10-CM

## 2013-06-27 DIAGNOSIS — I517 Cardiomegaly: Secondary | ICD-10-CM

## 2013-06-27 DIAGNOSIS — G4730 Sleep apnea, unspecified: Secondary | ICD-10-CM

## 2013-06-27 DIAGNOSIS — E782 Mixed hyperlipidemia: Secondary | ICD-10-CM

## 2013-06-27 DIAGNOSIS — Z0000 Encounter for general adult medical examination without abnormal findings: Secondary | ICD-10-CM

## 2013-06-27 DIAGNOSIS — J301 Allergic rhinitis due to pollen: Secondary | ICD-10-CM

## 2013-06-27 DIAGNOSIS — S335XXD Sprain of ligaments of lumbar spine, subsequent encounter: Secondary | ICD-10-CM

## 2013-06-27 DIAGNOSIS — E119 Type 2 diabetes mellitus without complications: Secondary | ICD-10-CM

## 2013-06-27 DIAGNOSIS — Z5189 Encounter for other specified aftercare: Secondary | ICD-10-CM

## 2013-06-27 DIAGNOSIS — Q211 Atrial septal defect, unspecified: Secondary | ICD-10-CM

## 2013-06-27 DIAGNOSIS — I4510 Unspecified right bundle-branch block: Secondary | ICD-10-CM

## 2013-06-27 HISTORY — DX: Cardiomegaly: I51.7

## 2013-06-27 NOTE — Telephone Encounter (Signed)
Received medical records from [facility]  P: [phone] F: [phone]

## 2013-06-27 NOTE — Assessment & Plan Note (Signed)
Zyrtec prn  

## 2013-06-27 NOTE — Assessment & Plan Note (Signed)
Repeat echo ordered today, offered cardiology consultation today due to numerous risk factors and need for stress testing. Patient declines for now but will discuss with wife. Needs to treat sleep apnea.

## 2013-06-27 NOTE — Assessment & Plan Note (Signed)
Last hgba1c 7.1 but has had significant weight gain since due to back pain. Has been testing am this day 165 which is higher than it used to be

## 2013-06-27 NOTE — Assessment & Plan Note (Signed)
Encouraged DASH diet, given handout, exercise as tolerated. Good sleep, treat sleep apnea, request copy of labs from endocrinology

## 2013-06-27 NOTE — Assessment & Plan Note (Addendum)
Failed Trokendi XR (Topiramate) 50, 100, 200 doses ineffective. Hydrocodone and Flexeril helps but he does not like to take them regularly.   They are considering Clonidine patches, can decrease Quinipril doses if need be but must maintain minimal dosing  Sees Dr Herminio Heads

## 2013-06-27 NOTE — Progress Notes (Signed)
Patient ID: Jake Mans., male   DOB: 02-06-1956, 57 y.o.   MRN: [id number] Jake Ortiz [id number] 10-23-1956 06/27/2013      Progress Note New Patient  Subjective  Chief Complaint  Chief Complaint  Patient presents with  . Annual Exam    physical    HPI  Patient is a 57 year old Caucasian male who is in today for annual exam. He follows every 6 months with his endocrinologist to check his lab work. He reports his last hemoglobin A1c was 7.1. He does acknowledge that was roughly 6 months ago and since then his back has been hurting more. As a result he's been more sedentary and had some epidural injections. His blood sugars have been running higher. In the a.m. He was 165 today. Has been approaching 200 at times. Has follow up with him soon. 2 recent illness. Denies chest pain, palpitations, shortness of breath, fatigue or malaise. No GI or GU complaints. Acknowledges he is no longer using his CPAP machine. Knowledge is increased fatigue as a result  Past Medical History  Diagnosis Date  . Diabetes mellitus   . Undiagnosed cardiac murmurs 01/25/2011  . SLEEP APNEA 01/25/2011  . Morbid obesity 01/25/2011  . Mixed hyperlipidemia 01/25/2011  . DM 01/25/2011  . DIVERTICULOSIS OF COLON 01/25/2011  . COLONIC POLYPS, BENIGN 01/25/2011  . BACK STRAIN, LUMBAR 01/25/2011  . ALLERGIC RHINITIS, SEASONAL 01/25/2011  . Hypocalcemia 03/04/2012  . Preventative health care 03/04/2012  . Chronic radicular low back pain 01/25/2011    Qualifier: Diagnosis of  By: [name], [name]  1. Focal annular tear of the L4-5 disc at the level of the right  neural foramen with a tiny disc protrusion compressing the right L4  nerve in the neural foramen. This is more prominent than on the  prior study.  2. 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  Follows with Dr [name]  . LAE (left atrial enlargement) 06/27/2013    Past Surgical History   Procedure Laterality Date  . Tonsillectomy    . Cholecystectomy    . Skin biopsy on abdomen      unknown results    Family History  Problem Relation Age of Onset  . Cancer Mother     metastatic breast ca  . Allergies Mother   . Diabetes Father   . Other Father     renal failure/ CHF  . Coronary artery disease Father     s/p MI  . Diabetes Sister     Type 1  . Coronary artery disease Sister     s/p stents  . Heart disease Sister   . Obesity Brother   . Other Brother     Surveyor, minerals  . Asthma Daughter   . Other Daughter     back disease  . Coronary artery disease Maternal Grandmother   . Diabetes Brother   . Heart disease Brother     s/p CABG  . Diabetes Brother     diet and oral meds  . Hypertension Brother   . Kidney disease Daughter     in childhood    History   Social History  . Marital Status: Married    Spouse Name: N/A    Number of Children: N/A  . Years of Education: N/A   Occupational History  . Not on file.   Social History Main Topics  . Smoking status: Former Smoker --  3.00 packs/day for 10 years    Quit date: 11/20/1990  . Smokeless tobacco: Never Used  . Alcohol Use: Yes     Comment: occasional  . Drug Use: No  . Sexually Active: Yes   Other Topics Concern  . Not on file   Social History Narrative  . No narrative on file    Current Outpatient Prescriptions on File Prior to Visit  Medication Sig Dispense Refill  . aspirin 325 MG tablet Take 325 mg by mouth daily.        Marland Kitchen atorvastatin (LIPITOR) 10 MG tablet Take 10 mg by mouth daily.      . cetirizine (ZYRTEC) 10 MG tablet Take 1 tablet (10 mg total) by mouth daily.  30 tablet  11  . cyclobenzaprine (FLEXERIL) 10 MG tablet Take 10 mg by mouth as needed. For back pain       . Hydrocodone-Acetaminophen 10-660 MG TABS Take by mouth every 6 (six) hours as needed.        . insulin aspart (NOVOLOG) 100 UNIT/ML injection Inject into the skin. Insulin pump       . metFORMIN  (GLUCOPHAGE) 1000 MG tablet Take 1,000 mg by mouth 2 (two) times daily.        . multivitamin (THERAGRAN) per tablet Take 1 tablet by mouth daily.        . quinapril (ACCUPRIL) 40 MG tablet Take 40 mg by mouth daily.         No current facility-administered medications on file prior to visit.    No Known Allergies  Review of Systems  Review of Systems  Constitutional: Negative for fever and malaise/fatigue.  HENT: Negative for congestion.   Eyes: Negative for pain and discharge.  Respiratory: Negative for shortness of breath.   Cardiovascular: Negative for chest pain, palpitations and leg swelling.  Gastrointestinal: Negative for nausea, abdominal pain and diarrhea.  Genitourinary: Negative for dysuria.  Musculoskeletal: Negative for falls.  Skin: Negative for rash.  Neurological: Negative for loss of consciousness and headaches.  Endo/Heme/Allergies: Negative for polydipsia.  Psychiatric/Behavioral: Negative for depression and suicidal ideas. The patient is not nervous/anxious and does not have insomnia.     Objective  BP 136/82  Pulse 90  Temp(Src) 98.1 F (36.7 C) (Oral)  Ht 5' 10.25" (1.784 m)  Wt 298 lb 0.6 oz (135.19 kg)  BMI 42.48 kg/m2  SpO2 95%  Physical Exam  Physical Exam  Constitutional: He is oriented to person, place, and time and well-developed, well-nourished, and in no distress. No distress.  HENT:  Head: Normocephalic and atraumatic.  Eyes: Conjunctivae are normal.  Neck: Neck supple. No thyromegaly present.  Cardiovascular: Normal rate and regular rhythm.  Exam reveals no gallop.   No murmur heard. Pulmonary/Chest: Effort normal and breath sounds normal. No respiratory distress.  Abdominal: He exhibits no distension and no mass. There is no tenderness.  Musculoskeletal: He exhibits no edema.  Neurological: He is alert and oriented to person, place, and time.  Skin: Skin is warm.  Psychiatric: Memory, affect and judgment normal.        Assessment & Plan  Chronic radicular low back pain Failed Trokendi XR (Topiramate) 50, 100, 200 doses ineffective. Hydrocodone and Flexeril helps but he does not like to take them regularly.   They are considering Clonidine patches, can decrease Quinipril doses if need be but must maintain minimal dosing  Sees Dr Herminio Heads  DM Last hgba1c 7.1 but has had significant weight gain since due  to back pain. Has been testing am this day 165 which is higher than it used to be  Mixed hyperlipidemia Tolerating Lipitor 10 mg daily, labs done with endocrinologist. Start a Krill oil cap daily  MORBID OBESITY Frustrated with weight gain, DASH diet recommended  SLEEP APNEA Not using CPAP machine, believes his last sleep study was before 2006. Is going to try and get a copy of old study. Is encouraged numerous times to proceed with pulmonology referral and new sleep study, patient will consider. Reviewed EKG results with patient and encouraged again to proceed with pulmonology, he agrees to discuss with wife and call back  COLONIC POLYPS, BENIGN Referred for follow up colonoscopy, asymptomatic, requested records from [facility] in [place]  LAE (left atrial enlargement) Repeat echo and EKG this am  ALLERGIC RHINITIS, SEASONAL Zyrtec prn  RBBB Repeat echo ordered today, offered cardiology consultation today due to numerous risk factors and need for stress testing. Patient declines for now but will discuss with wife. Needs to treat sleep apnea.  Preventative health care Encouraged DASH diet, given handout, exercise as tolerated. Good sleep, treat sleep apnea, request copy of labs from endocrinology

## 2013-06-27 NOTE — Assessment & Plan Note (Signed)
Repeat echo and EKG this am

## 2013-06-27 NOTE — Assessment & Plan Note (Signed)
Frustrated with weight gain, DASH diet recommended

## 2013-06-27 NOTE — Assessment & Plan Note (Addendum)
Not using CPAP machine, believes his last sleep study was before 2006. Is going to try and get a copy of old study. Is encouraged numerous times to proceed with pulmonology referral and new sleep study, patient will consider. Reviewed EKG results with patient and encouraged again to proceed with pulmonology, he agrees to discuss with wife and call back

## 2013-06-27 NOTE — Assessment & Plan Note (Signed)
Referred for follow up colonoscopy, asymptomatic, requested records from [facility] in [place]

## 2013-06-27 NOTE — Patient Instructions (Addendum)
Rel of rec [facility], Dr Osie Cheeks (sp?) endocrinology, copy of last 2 sets of labs.  [facility] in [place] copy of colonoscopy  100-140 over 60 to 90 If running on low side, split Quinipril 40 mg tab in 1/2 take daily, if still running low come in for check and new rx for a lower dose of Quinipril   Sleep Apnea  Sleep apnea is a sleep disorder characterized by abnormal pauses in breathing while you sleep. When your breathing pauses, the level of oxygen in your blood decreases. This causes you to move out of deep sleep and into light sleep. As a result, your quality of sleep is poor, and the system that carries your blood throughout your body (cardiovascular system) experiences stress. If sleep apnea remains untreated, the following conditions can develop:  High blood pressure (hypertension).  Coronary artery disease.  Inability to achieve or maintain an erection (impotence).  Impairment of your thought process (cognitive dysfunction). There are three types of sleep apnea: 1. Obstructive sleep apnea Pauses in breathing during sleep because of a blocked airway. 2. Central sleep apnea Pauses in breathing during sleep because the area of the brain that controls your breathing does not send the correct signals to the muscles that control breathing. 3. Mixed sleep apnea A combination of both obstructive and central sleep apnea. RISK FACTORS The following risk factors can increase your risk of developing sleep apnea:  Being overweight.  Smoking.  Having narrow passages in your nose and throat.  Being of older age.  Being male.  Alcohol use.  Sedative and tranquilizer use.  Ethnicity. Among individuals younger than 35 years, African Americans are at increased risk of sleep apnea. SYMPTOMS   Difficulty staying asleep.  Daytime sleepiness and fatigue.  Loss of energy.  Irritability.  Loud, heavy snoring.  Morning headaches.  Trouble  concentrating.  Forgetfulness.  Decreased interest in sex. DIAGNOSIS  In order to diagnose sleep apnea, your caregiver will perform a physical examination. Your caregiver may suggest that you take a home sleep test. Your caregiver may also recommend that you spend the night in a sleep lab. In the sleep lab, several monitors record information about your heart, lungs, and brain while you sleep. Your leg and arm movements and blood oxygen level are also recorded. TREATMENT The following actions may help to resolve mild sleep apnea:  Sleeping on your side.   Using a decongestant if you have nasal congestion.   Avoiding the use of depressants, including alcohol, sedatives, and narcotics.   Losing weight and modifying your diet if you are overweight. There also are devices and treatments to help open your airway:  Oral appliances. These are custom-made mouthpieces that shift your lower jaw forward and slightly open your bite. This opens your airway.  Devices that create positive airway pressure. This positive pressure "splints" your airway open to help you breathe better during sleep. The following devices create positive airway pressure:  Continuous positive airway pressure (CPAP) device. The CPAP device creates a continuous level of air pressure with an air pump. The air is delivered to your airway through a mask while you sleep. This continuous pressure keeps your airway open.  Nasal expiratory positive airway pressure (EPAP) device. The EPAP device creates positive air pressure as you exhale. The device consists of single-use valves, which are inserted into each nostril and held in place by adhesive. The valves create very little resistance when you inhale but create much more resistance when you exhale.  That increased resistance creates the positive airway pressure. This positive pressure while you exhale keeps your airway open, making it easier to breath when you inhale again.  Bilevel  positive airway pressure (BPAP) device. The BPAP device is used mainly in patients with central sleep apnea. This device is similar to the CPAP device because it also uses an air pump to deliver continuous air pressure through a mask. However, with the BPAP machine, the pressure is set at two different levels. The pressure when you exhale is lower than the pressure when you inhale.  Surgery. Typically, surgery is only done if you cannot comply with less invasive treatments or if the less invasive treatments do not improve your condition. Surgery involves removing excess tissue in your airway to create a wider passage way. Document Released: 10/27/2002 Document Revised: 05/07/2012 Document Reviewed: 03/14/2012 [facility] Patient Information 2014 [place], [place].

## 2013-06-27 NOTE — Assessment & Plan Note (Addendum)
Tolerating Lipitor 10 mg daily, labs done with endocrinologist. Start a Krill oil cap daily

## 2013-06-30 ENCOUNTER — Telehealth: Payer: Self-pay | Admitting: Family Medicine

## 2013-06-30 NOTE — Telephone Encounter (Signed)
Received medical records from [facility]  P: [phone] F: [phone]

## 2013-07-02 ENCOUNTER — Ambulatory Visit (HOSPITAL_BASED_OUTPATIENT_CLINIC_OR_DEPARTMENT_OTHER)
Admission: RE | Admit: 2013-07-02 | Discharge: 2013-07-02 | Disposition: A | Discharge status: Home / Self Care | Payer: BC Managed Care – PPO | Source: Ambulatory Visit | Attending: Family Medicine | Admitting: Family Medicine

## 2013-07-02 DIAGNOSIS — I517 Cardiomegaly: Secondary | ICD-10-CM | POA: Insufficient documentation

## 2013-07-02 DIAGNOSIS — E782 Mixed hyperlipidemia: Secondary | ICD-10-CM

## 2013-07-02 DIAGNOSIS — G4730 Sleep apnea, unspecified: Secondary | ICD-10-CM | POA: Insufficient documentation

## 2013-07-02 DIAGNOSIS — R011 Cardiac murmur, unspecified: Secondary | ICD-10-CM | POA: Insufficient documentation

## 2013-07-02 DIAGNOSIS — I10 Essential (primary) hypertension: Secondary | ICD-10-CM | POA: Insufficient documentation

## 2013-07-02 DIAGNOSIS — Z6841 Body Mass Index (BMI) 40.0 and over, adult: Secondary | ICD-10-CM | POA: Insufficient documentation

## 2013-07-02 DIAGNOSIS — Z1211 Encounter for screening for malignant neoplasm of colon: Secondary | ICD-10-CM

## 2013-07-02 DIAGNOSIS — E669 Obesity, unspecified: Secondary | ICD-10-CM | POA: Insufficient documentation

## 2013-07-02 DIAGNOSIS — S335XXD Sprain of ligaments of lumbar spine, subsequent encounter: Secondary | ICD-10-CM

## 2013-07-02 DIAGNOSIS — E119 Type 2 diabetes mellitus without complications: Secondary | ICD-10-CM | POA: Insufficient documentation

## 2013-07-02 DIAGNOSIS — Q211 Atrial septal defect: Secondary | ICD-10-CM

## 2013-07-02 NOTE — Progress Notes (Signed)
  Echocardiogram 2D Echocardiogram has been performed.  Dorothey Baseman 07/02/2013, 10:06 AM

## 2013-07-04 ENCOUNTER — Encounter: Payer: Self-pay | Admitting: Family Medicine

## 2013-07-14 ENCOUNTER — Encounter: Payer: Self-pay | Admitting: Internal Medicine

## 2013-09-04 ENCOUNTER — Ambulatory Visit (AMBULATORY_SURGERY_CENTER): Payer: Self-pay

## 2013-09-04 ENCOUNTER — Encounter: Payer: Self-pay | Admitting: Internal Medicine

## 2013-09-04 VITALS — Ht 72.0 in | Wt 307.0 lb

## 2013-09-04 DIAGNOSIS — Z860100 Personal history of colon polyps, unspecified: Secondary | ICD-10-CM

## 2013-09-04 MED ORDER — SUPREP BOWEL PREP KIT 17.5-3.13-1.6 GM/177ML PO SOLN: 1.0000 | Freq: Once | ORAL | Status: DC

## 2013-09-25 ENCOUNTER — Encounter: Payer: Self-pay | Admitting: Internal Medicine

## 2013-09-25 ENCOUNTER — Ambulatory Visit (AMBULATORY_SURGERY_CENTER): Payer: BC Managed Care – PPO | Admitting: Internal Medicine

## 2013-09-25 ENCOUNTER — Other Ambulatory Visit: Payer: Self-pay

## 2013-09-25 VITALS — BP 144/95 | HR 77 | Temp 97.3°F | Resp 15 | Ht 72.0 in | Wt 307.0 lb

## 2013-09-25 DIAGNOSIS — D126 Benign neoplasm of colon, unspecified: Secondary | ICD-10-CM

## 2013-09-25 DIAGNOSIS — K5730 Diverticulosis of large intestine without perforation or abscess without bleeding: Secondary | ICD-10-CM

## 2013-09-25 DIAGNOSIS — Z86010 Personal history of colonic polyps: Secondary | ICD-10-CM

## 2013-09-25 MED ORDER — SODIUM CHLORIDE 0.9 % IV SOLN: 500.0000 mL | INTRAVENOUS | Status: DC

## 2013-09-25 NOTE — Progress Notes (Signed)
Called to room to assist during endoscopic procedure.  Patient ID and intended procedure confirmed with present staff. Received instructions for my participation in the procedure from the performing physician.  

## 2013-09-25 NOTE — Patient Instructions (Addendum)
I found and removed 2 tiny polyps. You also have a condition called diverticulosis - common and not usually a problem. Please read the handout provided.  I will let you know pathology results and when to have another routine colonoscopy by mail.  It is the time of year to have a vaccination to prevent the flu (influenza virus). Please have this done through your primary care provider or you can get this done at local pharmacies or the [facility]. It would be very helpful if you notify your primary care provider when and where you had the vaccination given by messaging them in My Chart, leaving a message or faxing the information. YOU HAD AN ENDOSCOPIC PROCEDURE TODAY AT THE [facility]: Refer to the procedure report that was given to you for any specific questions about what was found during the examination.  If the procedure report does not answer your questions, please call your gastroenterologist to clarify.  If you requested that your care partner not be given the details of your procedure findings, then the procedure report has been included in a sealed envelope for you to review at your convenience later.  YOU SHOULD EXPECT: Some feelings of bloating in the abdomen. Passage of more gas than usual.  Walking can help get rid of the air that was put into your GI tract during the procedure and reduce the bloating. If you had a lower endoscopy (such as a colonoscopy or flexible sigmoidoscopy) you may notice spotting of blood in your stool or on the toilet paper. If you underwent a bowel prep for your procedure, then you may not have a normal bowel movement for a few days.  DIET: Your first meal following the procedure should be a light meal and then it is ok to progress to your normal diet.  A half-sandwich or bowl of soup is an example of a good first meal.  Heavy or fried foods are harder to digest and may make you feel nauseous or bloated.  Likewise meals heavy in dairy and vegetables  can cause extra gas to form and this can also increase the bloating.  Drink plenty of fluids but you should avoid alcoholic beverages for 24 hours.  ACTIVITY: Your care partner should take you home directly after the procedure.  You should plan to take it easy, moving slowly for the rest of the day.  You can resume normal activity the day after the procedure however you should NOT DRIVE or use heavy machinery for 24 hours (because of the sedation medicines used during the test).    SYMPTOMS TO REPORT IMMEDIATELY: A gastroenterologist can be reached at any hour.  During normal business hours, 8:30 AM to 5:00 PM Monday through Friday, call [phone].  After hours and on weekends, please call the GI answering service at [phone] who will take a message and have the physician on call contact you.   Following lower endoscopy (colonoscopy or flexible sigmoidoscopy):  Excessive amounts of blood in the stool  Significant tenderness or worsening of abdominal pains  Swelling of the abdomen that is new, acute  Fever of 100F or higher  FOLLOW UP: If any biopsies were taken you will be contacted by phone or by letter within the next 1-3 weeks.  Call your gastroenterologist if you have not heard about the biopsies in 3 weeks.  Our staff will call the home number listed on your records the next business day following your procedure to check on you  and address any questions or concerns that you may have at that time regarding the information given to you following your procedure. This is a courtesy call and so if there is no answer at the home number and we have not heard from you through the emergency physician on call, we will assume that you have returned to your regular daily activities without incident.  SIGNATURES/CONFIDENTIALITY: You and/or your care partner have signed paperwork which will be entered into your electronic medical record.  These signatures attest to the fact that that the  information above on your After Visit Summary has been reviewed and is understood.  Full responsibility of the confidentiality of this discharge information lies with you and/or your care-partner.  Polyps, Diverticulosis-handouts given  Repeat colonoscopy will be determined by pathology.

## 2013-09-25 NOTE — Progress Notes (Signed)
Lidocaine-40mg IV prior to Propofol InductionPropofol given over incremental dosages 

## 2013-09-25 NOTE — Op Note (Signed)
[facility] [address] [place], [address]   COLONOSCOPY PROCEDURE REPORT  PATIENT: Jake Ortiz, Jake Ortiz  MR#: [id number] BIRTHDATE: 1956-08-18 , 57  yrs. old GENDER: Male ENDOSCOPIST: Iva Boop, MD, [facility] REFERRED VH:QIONG Abner Greenspan, M.D. PROCEDURE DATE:  09/25/2013 PROCEDURE:   Colonoscopy with snare polypectomy First Screening Colonoscopy - Avg.  risk and is 50 yrs.  old or older - No.  Prior Negative Screening - Now for repeat screening. N/A  History of Adenoma - Now for follow-up colonoscopy & has been > or = to 3 yrs.  Yes hx of adenoma.  Has been 3 or more years since last colonoscopy.  Polyps Removed Today? Yes. ASA CLASS:   Class III INDICATIONS:Patient's personal history of adenomatous colon polyps.  MEDICATIONS: propofol (Diprivan) 250mg  IV, MAC sedation, administered by CRNA, and These medications were titrated to patient response per physician's verbal order  DESCRIPTION OF PROCEDURE:   After the risks benefits and alternatives of the procedure were thoroughly explained, informed consent was obtained.  A digital rectal exam revealed no abnormalities of the rectum, A digital rectal exam revealed no prostatic nodules, and A digital rectal exam revealed the prostate was not enlarged.   The [id number]  endoscope was introduced through the anus and advanced to the cecum, which was identified by both the appendix and ileocecal valve. No adverse events experienced.   The quality of the prep was Suprep good  The instrument was then slowly withdrawn as the colon was fully examined.   COLON FINDINGS: Two diminutive sessile polyps were found in the ascending colon and rectum.  A polypectomy was performed with a cold snare.  The resection was complete and the polyp tissue was completely retrieved.   Severe diverticulosis was noted in the sigmoid colon.   The colon mucosa was otherwise normal.   A right colon retroflexion was performed.   Retroflexed views revealed no abnormalities. The time to cecum=2 minutes 08 seconds.  Withdrawal time=13 minutes 32 seconds.  The scope was withdrawn and the procedure completed. COMPLICATIONS: There were no complications.  ENDOSCOPIC IMPRESSION: 1.   Two diminutive sessile polyps were found in the ascending colon and rectum; polypectomy was performed with a cold snare 2.   Severe diverticulosis was noted in the sigmoid colon 3.   The colon mucosa was otherwise normal - good prep - hx 5mm sessile serrated adenoma 2008  RECOMMENDATIONS: Timing of repeat colonoscopy will be determined by pathology findings.  eSigned:  Iva Boop, MD, [facility] 09/25/2013 9:00 AM  cc: Reuel Derby, MD and The Patient

## 2013-09-25 NOTE — Progress Notes (Signed)
Patient did not experience any of the following events: a burn prior to discharge; a fall within the facility; wrong site/side/patient/procedure/implant event; or a hospital transfer or hospital admission upon discharge from the facility. (G8907) Patient did not have preoperative order for IV antibiotic SSI prophylaxis. (G8918)  

## 2013-09-26 ENCOUNTER — Telehealth: Payer: Self-pay | Admitting: *Deleted

## 2013-09-26 NOTE — Telephone Encounter (Signed)
  Follow up Call-  Call back number 09/25/2013  Post procedure Call Back phone  # [phone]  Permission to leave phone message Yes     Patient questions:  Do you have a fever, pain , or abdominal swelling? no Pain Score  0 *  Have you tolerated food without any problems? yes  Have you been able to return to your normal activities? yes  Do you have any questions about your discharge instructions: Diet   no Medications  no Follow up visit  no  Do you have questions or concerns about your Care? no  Actions: * If pain score is 4 or above: No action needed, pain <4.

## 2013-10-02 ENCOUNTER — Encounter: Payer: Self-pay | Admitting: Internal Medicine

## 2013-10-02 NOTE — Progress Notes (Signed)
Quick Note:  Diminutive adenoma and one hyperplastic Hx ssa Repeat colon 5-7 years ______

## 2013-10-27 ENCOUNTER — Ambulatory Visit: Payer: BC Managed Care – PPO | Admitting: Family Medicine

## 2013-11-04 ENCOUNTER — Encounter: Payer: Self-pay | Admitting: Family Medicine

## 2013-11-04 ENCOUNTER — Ambulatory Visit (HOSPITAL_BASED_OUTPATIENT_CLINIC_OR_DEPARTMENT_OTHER)
Admission: RE | Admit: 2013-11-04 | Discharge: 2013-11-04 | Disposition: A | Discharge status: Home / Self Care | Payer: BC Managed Care – PPO | Source: Ambulatory Visit | Attending: Family Medicine | Admitting: Family Medicine

## 2013-11-04 ENCOUNTER — Ambulatory Visit (INDEPENDENT_AMBULATORY_CARE_PROVIDER_SITE_OTHER): Payer: BC Managed Care – PPO | Admitting: Family Medicine

## 2013-11-04 VITALS — BP 140/80 | HR 85 | Temp 97.7°F | Ht 70.25 in | Wt 305.1 lb

## 2013-11-04 DIAGNOSIS — E782 Mixed hyperlipidemia: Secondary | ICD-10-CM

## 2013-11-04 DIAGNOSIS — K760 Fatty (change of) liver, not elsewhere classified: Secondary | ICD-10-CM

## 2013-11-04 DIAGNOSIS — R059 Cough, unspecified: Secondary | ICD-10-CM

## 2013-11-04 DIAGNOSIS — M47814 Spondylosis without myelopathy or radiculopathy, thoracic region: Secondary | ICD-10-CM | POA: Insufficient documentation

## 2013-11-04 DIAGNOSIS — G4730 Sleep apnea, unspecified: Secondary | ICD-10-CM

## 2013-11-04 DIAGNOSIS — R05 Cough: Secondary | ICD-10-CM

## 2013-11-04 DIAGNOSIS — R079 Chest pain, unspecified: Secondary | ICD-10-CM | POA: Insufficient documentation

## 2013-11-04 DIAGNOSIS — E119 Type 2 diabetes mellitus without complications: Secondary | ICD-10-CM

## 2013-11-04 DIAGNOSIS — K7689 Other specified diseases of liver: Secondary | ICD-10-CM

## 2013-11-04 DIAGNOSIS — M79621 Pain in right upper arm: Secondary | ICD-10-CM

## 2013-11-04 DIAGNOSIS — R0789 Other chest pain: Secondary | ICD-10-CM

## 2013-11-04 DIAGNOSIS — M79609 Pain in unspecified limb: Secondary | ICD-10-CM

## 2013-11-04 NOTE — Progress Notes (Signed)
Pre visit review using our clinic review tool, if applicable. No additional management support is needed unless otherwise documented below in the visit note. 

## 2013-11-04 NOTE — Patient Instructions (Addendum)
Salon Pas patches to area of concern if no improvement call to schedule RUQ ultrasound  Sleep Apnea  Sleep apnea is a sleep disorder characterized by abnormal pauses in breathing while you sleep. When your breathing pauses, the level of oxygen in your blood decreases. This causes you to move out of deep sleep and into light sleep. As a result, your quality of sleep is poor, and the system that carries your blood throughout your body (cardiovascular system) experiences stress. If sleep apnea remains untreated, the following conditions can develop:  High blood pressure (hypertension).  Coronary artery disease.  Inability to achieve or maintain an erection (impotence).  Impairment of your thought process (cognitive dysfunction). There are three types of sleep apnea: 1. Obstructive sleep apnea Pauses in breathing during sleep because of a blocked airway. 2. Central sleep apnea Pauses in breathing during sleep because the area of the brain that controls your breathing does not send the correct signals to the muscles that control breathing. 3. Mixed sleep apnea A combination of both obstructive and central sleep apnea. RISK FACTORS The following risk factors can increase your risk of developing sleep apnea:  Being overweight.  Smoking.  Having narrow passages in your nose and throat.  Being of older age.  Being male.  Alcohol use.  Sedative and tranquilizer use.  Ethnicity. Among individuals younger than 35 years, African Americans are at increased risk of sleep apnea. SYMPTOMS   Difficulty staying asleep.  Daytime sleepiness and fatigue.  Loss of energy.  Irritability.  Loud, heavy snoring.  Morning headaches.  Trouble concentrating.  Forgetfulness.  Decreased interest in sex. DIAGNOSIS  In order to diagnose sleep apnea, your caregiver will perform a physical examination. Your caregiver may suggest that you take a home sleep test. Your caregiver may also recommend  that you spend the night in a sleep lab. In the sleep lab, several monitors record information about your heart, lungs, and brain while you sleep. Your leg and arm movements and blood oxygen level are also recorded. TREATMENT The following actions may help to resolve mild sleep apnea:  Sleeping on your side.   Using a decongestant if you have nasal congestion.   Avoiding the use of depressants, including alcohol, sedatives, and narcotics.   Losing weight and modifying your diet if you are overweight. There also are devices and treatments to help open your airway:  Oral appliances. These are custom-made mouthpieces that shift your lower jaw forward and slightly open your bite. This opens your airway.  Devices that create positive airway pressure. This positive pressure "splints" your airway open to help you breathe better during sleep. The following devices create positive airway pressure:  Continuous positive airway pressure (CPAP) device. The CPAP device creates a continuous level of air pressure with an air pump. The air is delivered to your airway through a mask while you sleep. This continuous pressure keeps your airway open.  Nasal expiratory positive airway pressure (EPAP) device. The EPAP device creates positive air pressure as you exhale. The device consists of single-use valves, which are inserted into each nostril and held in place by adhesive. The valves create very little resistance when you inhale but create much more resistance when you exhale. That increased resistance creates the positive airway pressure. This positive pressure while you exhale keeps your airway open, making it easier to breath when you inhale again.  Bilevel positive airway pressure (BPAP) device. The BPAP device is used mainly in patients with central sleep apnea. This  device is similar to the CPAP device because it also uses an air pump to deliver continuous air pressure through a mask. However, with the  BPAP machine, the pressure is set at two different levels. The pressure when you exhale is lower than the pressure when you inhale.  Surgery. Typically, surgery is only done if you cannot comply with less invasive treatments or if the less invasive treatments do not improve your condition. Surgery involves removing excess tissue in your airway to create a wider passage way. Document Released: 10/27/2002 Document Revised: 03/03/2013 Document Reviewed: 03/14/2012 [employer] Patient Information 2014 [place], [place]. DASH Diet The DASH diet stands for "Dietary Approaches to Stop Hypertension." It is a healthy eating plan that has been shown to reduce high blood pressure (hypertension) in as little as 14 days, while also possibly providing other significant health benefits. These other health benefits include reducing the risk of breast cancer after menopause and reducing the risk of type 2 diabetes, heart disease, colon cancer, and stroke. Health benefits also include weight loss and slowing kidney failure in patients with chronic kidney disease.  DIET GUIDELINES  Limit salt (sodium). Your diet should contain less than 1500 mg of sodium daily.  Limit refined or processed carbohydrates. Your diet should include mostly whole grains. Desserts and added sugars should be used sparingly.  Include small amounts of heart-healthy fats. These types of fats include nuts, oils, and tub margarine. Limit saturated and trans fats. These fats have been shown to be harmful in the body. CHOOSING FOODS  The following food groups are based on a 2000 calorie diet. See your Registered Dietitian for individual calorie needs. Grains and Grain Products (6 to 8 servings daily)  Eat More Often: Whole-wheat bread, brown rice, whole-grain or wheat pasta, quinoa, popcorn without added fat or salt (air popped).  Eat Less Often: White bread, white pasta, white rice, cornbread. Vegetables (4 to 5 servings daily)  Eat More Often:  Fresh, frozen, and canned vegetables. Vegetables may be raw, steamed, roasted, or grilled with a minimal amount of fat.  Eat Less Often/Avoid: Creamed or fried vegetables. Vegetables in a cheese sauce. Fruit (4 to 5 servings daily)  Eat More Often: All fresh, canned (in natural juice), or frozen fruits. Dried fruits without added sugar. One hundred percent fruit juice ( cup [237 mL] daily).  Eat Less Often: Dried fruits with added sugar. Canned fruit in light or heavy syrup. [employer], Fish, and Poultry (2 servings or less daily. One serving is 3 to 4 oz [85-114 g]).  Eat More Often: Ninety percent or leaner ground beef, tenderloin, sirloin. Round cuts of beef, chicken breast, [place] breast. All fish. Grill, bake, or broil your meat. Nothing should be fried.  Eat Less Often/Avoid: Fatty cuts of meat, [place], or chicken leg, thigh, or wing. Fried cuts of meat or fish. Dairy (2 to 3 servings)  Eat More Often: Low-fat or fat-free milk, low-fat plain or light yogurt, reduced-fat or part-skim cheese.  Eat Less Often/Avoid: Milk (whole, 2%).Whole milk yogurt. Full-fat cheeses. Nuts, Seeds, and Legumes (4 to 5 servings per week)  Eat More Often: All without added salt.  Eat Less Often/Avoid: Salted nuts and seeds, canned beans with added salt. Fats and Sweets (limited)  Eat More Often: Vegetable oils, tub margarines without trans fats, sugar-free gelatin. Mayonnaise and salad dressings.  Eat Less Often/Avoid: Coconut oils, palm oils, butter, stick margarine, cream, half and half, cookies, candy, pie. FOR MORE INFORMATION The Dash Diet Eating Plan:  www.dashdiet.org Document Released: 10/26/2011 Document Revised: 01/29/2012 Document Reviewed: 10/26/2011 [facility] Patient Information 2014 [place], [place].

## 2013-11-04 NOTE — Progress Notes (Signed)
Patient ID: Jake Ortiz, male   DOB: [birth year], 57 y.o.   MRN: [id number] Rondle Lohse [id number] [birth year] 11/04/2013      Progress Note-Follow Up  Subjective  Chief Complaint  Chief Complaint  Patient presents with  . Follow-up    6 mont    HPI  Patient is a 59 male who is in today for followup. She is continuing with endocrinology at the [facility]. He reports recent good sugar control. He notes a nagging cough that is nonproductive and starts as a tickle in his throat no fevers or chills. Is complaining of a typical axillary chest pain. Describes it as a dull ache at a level of 2-3 for over a month. Can increase her meds to 6/10 but he can't associate it with a pattern. Denies reflux. Denies any worsening with position. Denies any shortness of breath or palpitations. Also denies any GI or GU complaints at this time. He was having difficulty with his legs but that is improved. He reports his last hemoglobin A1c 7.8.  Past Medical History  Diagnosis Date  . Diabetes mellitus   . Undiagnosed cardiac murmurs 01/25/2011  . SLEEP APNEA 01/25/2011  . Morbid obesity 01/25/2011  . Mixed hyperlipidemia 01/25/2011  . DM 01/25/2011  . DIVERTICULOSIS OF COLON 01/25/2011  . COLONIC POLYPS, BENIGN 01/25/2011  . BACK STRAIN, LUMBAR 01/25/2011  . ALLERGIC RHINITIS, SEASONAL 01/25/2011  . Hypocalcemia 03/04/2012  . Preventative health care 03/04/2012  . Chronic radicular low back pain 01/25/2011    Qualifier: Diagnosis of  By: [name], Earlie Schank  1. Focal annular tear of the L4-5 disc at the level of the right  neural foramen with a tiny disc protrusion compressing the right L4  nerve in the neural foramen. This is more prominent than on the  prior study.  2. 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  Follows with Dr [name] [name]  . LAE (left atrial enlargement) 06/27/2013  . RBBB 01/25/2011    Qualifier: Diagnosis of  By: [name],  Misty Stanley    . Personal history of colonic polyps- sessile serrated adenoma 12/21/2006    Past Surgical History  Procedure Laterality Date  . Tonsillectomy    . Cholecystectomy    . Skin biopsy on abdomen      unknown results    Family History  Problem Relation Age of Onset  . Cancer Mother     metastatic breast ca  . Allergies Mother   . Diabetes Father   . Other Father     renal failure/ CHF  . Coronary artery disease Father     s/p MI  . Diabetes Sister     Type 1  . Coronary artery disease Sister     s/p stents  . Heart disease Sister   . Obesity Brother   . Other Brother     Surveyor, minerals  . Asthma Daughter   . Other Daughter     back disease  . Coronary artery disease Maternal Grandmother   . Diabetes Brother   . Heart disease Brother     s/p CABG  . Diabetes Brother     diet and oral meds  . Hypertension Brother   . Kidney disease Daughter     in childhood  . Colon cancer Neg Hx   . Pancreatic cancer Neg Hx   . Stomach cancer Neg Hx     History  Social History  . Marital Status: Married    Spouse Name: N/A    Number of Children: N/A  . Years of Education: N/A   Occupational History  . Not on file.   Social History Main Topics  . Smoking status: Former Smoker -- 3.00 packs/day for 10 years    Quit date: 11/20/1990  . Smokeless tobacco: Never Used  . Alcohol Use: Yes     Comment: occasional  . Drug Use: No  . Sexual Activity: Yes   Other Topics Concern  . Not on file   Social History Narrative  . No narrative on file    Current Outpatient Prescriptions on File Prior to Visit  Medication Sig Dispense Refill  . aspirin 325 MG tablet Take 325 mg by mouth daily.        Marland Kitchen atorvastatin (LIPITOR) 10 MG tablet Take 10 mg by mouth daily.      . cetirizine (ZYRTEC) 10 MG tablet Take 1 tablet (10 mg total) by mouth daily.  30 tablet  11  . cyclobenzaprine (FLEXERIL) 10 MG tablet Take 10 mg by mouth as needed. For back pain       .  Hydrocodone-Acetaminophen 10-660 MG TABS Take by mouth every 6 (six) hours as needed.        . insulin aspart (NOVOLOG) 100 UNIT/ML injection Inject into the skin. Insulin pump       . metFORMIN (GLUCOPHAGE) 1000 MG tablet Take 1,000 mg by mouth 2 (two) times daily.        . multivitamin (THERAGRAN) per tablet Take 1 tablet by mouth daily.        . quinapril (ACCUPRIL) 40 MG tablet Take 40 mg by mouth daily.         No current facility-administered medications on file prior to visit.    No Known Allergies  Review of Systems  Review of Systems  Constitutional: Negative for fever and malaise/fatigue.  HENT: Negative for congestion.   Eyes: Negative for discharge.  Respiratory: Negative for shortness of breath.   Cardiovascular: Negative for chest pain, palpitations and leg swelling.  Gastrointestinal: Negative for nausea, abdominal pain and diarrhea.  Genitourinary: Negative for dysuria.  Musculoskeletal: Negative for falls.  Skin: Negative for rash.  Neurological: Negative for loss of consciousness and headaches.  Endo/Heme/Allergies: Negative for polydipsia.  Psychiatric/Behavioral: Negative for depression and suicidal ideas. The patient is not nervous/anxious and does not have insomnia.     Objective  BP 140/80  Pulse 85  Temp(Src) 97.7 F (36.5 C) (Oral)  Ht 5' 10.25" (1.784 m)  Wt 305 lb 1.3 oz (138.383 kg)  BMI 43.48 kg/m2  SpO2 85%  Physical Exam  Physical Exam  Constitutional: He is oriented to person, place, and time and well-developed, well-nourished, and in no distress. No distress.  HENT:  Head: Normocephalic and atraumatic.  Eyes: Conjunctivae are normal.  Neck: Neck supple. No thyromegaly present.  Cardiovascular: Normal rate, regular rhythm and normal heart sounds.   No murmur heard. Pulmonary/Chest: Effort normal and breath sounds normal. No respiratory distress.  Abdominal: He exhibits no distension and no mass. There is no tenderness.   Musculoskeletal: He exhibits no edema.  Neurological: He is alert and oriented to person, place, and time.  Skin: Skin is warm.  Psychiatric: Memory, affect and judgment normal.    No results found for this basename: TSH   No results found for this basename: WBC, HGB, HCT, MCV, PLT   Lab Results  Component Value  Date   CREATININE 0.8 03/04/2012   BUN 10 03/04/2012   NA 137 03/04/2012   K 4.1 03/04/2012   CL 103 03/04/2012   CO2 26 03/04/2012     Assessment & Plan  Pain in right axilla Constant level of 2-3 and can increase to 6 of 10 at times. CXR unremarkable, no associated symptoms. Appears to be musculoskeletal. Encouraged topical treatments and NSAIDs if no improvement return for further consideration.   Cough Likely multifactorial is referred to pulmonology for further consideration. Encouraged Zantac use as well.   MORBID OBESITY Discussed need for heart healthy diet such as DASH and regular exercise.   Mixed hyperlipidemia Tolerating Atorvastatin. Avoid trans fats  DM Continue current meds.

## 2013-11-10 ENCOUNTER — Encounter: Payer: Self-pay | Admitting: Family Medicine

## 2013-11-10 DIAGNOSIS — M79621 Pain in right upper arm: Secondary | ICD-10-CM

## 2013-11-10 DIAGNOSIS — K760 Fatty (change of) liver, not elsewhere classified: Secondary | ICD-10-CM | POA: Insufficient documentation

## 2013-11-10 DIAGNOSIS — R059 Cough, unspecified: Secondary | ICD-10-CM | POA: Insufficient documentation

## 2013-11-10 DIAGNOSIS — R05 Cough: Secondary | ICD-10-CM | POA: Insufficient documentation

## 2013-11-10 HISTORY — DX: Cough, unspecified: R05.9

## 2013-11-10 HISTORY — DX: Pain in right upper arm: M79.621

## 2013-11-10 NOTE — Assessment & Plan Note (Signed)
Continue current meds 

## 2013-11-10 NOTE — Assessment & Plan Note (Signed)
Likely multifactorial is referred to pulmonology for further consideration. Encouraged Zantac use as well.

## 2013-11-10 NOTE — Assessment & Plan Note (Signed)
Discussed need for heart healthy diet such as DASH and regular exercise.

## 2013-11-10 NOTE — Assessment & Plan Note (Signed)
Constant level of 2-3 and can increase to 6 of 10 at times. CXR unremarkable, no associated symptoms. Appears to be musculoskeletal. Encouraged topical treatments and NSAIDs if no improvement return for further consideration.

## 2013-11-10 NOTE — Assessment & Plan Note (Signed)
Tolerating Atorvastatin. Avoid trans fats.  

## 2013-11-11 ENCOUNTER — Telehealth: Payer: Self-pay

## 2013-11-11 ENCOUNTER — Telehealth: Payer: Self-pay | Admitting: Family Medicine

## 2013-11-11 MED ORDER — LOSARTAN POTASSIUM 25 MG PO TABS: 25.0000 mg | ORAL_TABLET | Freq: Every day | ORAL | Status: DC

## 2013-11-11 NOTE — Telephone Encounter (Signed)
I spoke to patient and he states he will get the RX at [facility] pharmacy when he gets home in a week. Pt was informed that we will send in a 90 day supply once we know pt tolerates medication

## 2013-11-11 NOTE — Telephone Encounter (Signed)
Wife calling to say they got the message from [facility] and did not understand what medicine had been called in and to where it was called.  Also wanted to be sure it was for a 90 day supply

## 2013-11-11 NOTE — Telephone Encounter (Signed)
Message copied by Court Joy on Tue Nov 11, 2013 12:38 PM ------      Message from: Danise Edge A      Created: Mon Nov 10, 2013  9:01 AM       While reviewing chart realized he takes Quinapril and he has a persistent cough. Have him stop the Quinapril and start Losartan 25 mg daily. Disp #30 with 3 rf.  ------

## 2013-11-11 NOTE — Telephone Encounter (Signed)
Left a detailed message on patients vm. Sent RX

## 2013-12-02 ENCOUNTER — Encounter: Payer: Self-pay | Admitting: Family Medicine

## 2013-12-25 ENCOUNTER — Encounter: Payer: Self-pay | Admitting: Pulmonary Disease

## 2013-12-25 ENCOUNTER — Ambulatory Visit (INDEPENDENT_AMBULATORY_CARE_PROVIDER_SITE_OTHER): Payer: BC Managed Care – PPO | Admitting: Pulmonary Disease

## 2013-12-25 VITALS — BP 138/74 | HR 83 | Ht 72.0 in | Wt 300.0 lb

## 2013-12-25 DIAGNOSIS — G4730 Sleep apnea, unspecified: Secondary | ICD-10-CM

## 2013-12-25 DIAGNOSIS — J301 Allergic rhinitis due to pollen: Secondary | ICD-10-CM

## 2013-12-25 NOTE — Patient Instructions (Signed)
You have moderate obstructive sleep apnea  We will get you an autoCPAP - will look at report in 3-4 weeks Call [phone] -sleep lab - to set up appt for mask fitting Trial of mucinex D daily x 4 weeks Stay on zyrtec &  nasonex spray each nare at bedtime If no better, call for ENt appt

## 2013-12-25 NOTE — Assessment & Plan Note (Addendum)
You have moderate obstructive sleep apnea  He does not want to repeat a study We will get you an autoCPAP - will look at report in 3-4 weeks Call [phone] -sleep lab - to set up appt for mask fitting  Weight loss encouraged, compliance with goal of at least 4-6 hrs every night is the expectation. Advised against medications with sedative side effects Cautioned against driving when sleepy - understanding that sleepiness will vary on a day to day basis

## 2013-12-25 NOTE — Progress Notes (Signed)
Subjective:    Patient ID: Jake Ortiz, male    DOB: [birth year], 58 y.o.   MRN: [id number]  HPI 58 year old obese diabetic presents for evaluation of obstructive sleep apnea  PSG in 2006 [facility] falls) showed AHI 25/h , desatn to 85% corrected by CPAP 12 cm with med FF mask He has gained 50-70 lbs since this study Was unable to use CPAP due to sinus issues, did not tolerate nasal mask or FF - discomfort. ESS 11/24 Bedtime is around 11pm, sleep latency minimal, 3-4 awakenings every 3h, BR x 1, sleeps on his side x 2 pillows, oob by 0630 feeling tired, denies headache or dryness. There is no history suggestive of cataplexy, sleep paralysis or parasomnias  He has tried zyrtec, nasonex & netti potfor sinuses Wife who is a Therapist, sports , noted loud snoring but he denies any witnessed apneas. His PCP is fairly convinced him to seek attention for this.    Past Medical History  Diagnosis Date  . Diabetes mellitus   . Undiagnosed cardiac murmurs 01/25/2011  . SLEEP APNEA 01/25/2011  . Morbid obesity 01/25/2011  . Mixed hyperlipidemia 01/25/2011  . DM 01/25/2011  . DIVERTICULOSIS OF COLON 01/25/2011  . COLONIC POLYPS, BENIGN 01/25/2011  . BACK STRAIN, LUMBAR 01/25/2011  . ALLERGIC RHINITIS, SEASONAL 01/25/2011  . Hypocalcemia 03/04/2012  . Preventative health care 03/04/2012  . Chronic radicular low back pain 01/25/2011    Qualifier: Diagnosis of  By: Charlett Blake MD, Stacey  1. Focal annular tear of the L4-5 disc at the level of the right  neural foramen with a tiny disc protrusion compressing the right L4  nerve in the neural foramen. This is more prominent than on the  prior study.  2. 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  Follows with [facility] Genevie [facility]  . LAE (left atrial enlargement) 06/27/2013  . RBBB 01/25/2011    Qualifier: Diagnosis of  By: Charlett Blake MD, Erline Levine    . Personal history of colonic polyps- sessile serrated adenoma 12/21/2006  . Cough  11/10/2013  . Pain in right axilla 11/10/2013   Past Surgical History  Procedure Laterality Date  . Tonsillectomy    . Cholecystectomy    . Skin biopsy on abdomen      unknown results   No Known Allergies  History   Social History  . Marital Status: Married    Spouse Name: N/A    Number of Children: 4  . Years of Education: N/A   Occupational History  . retired    Social History Main Topics  . Smoking status: Former Smoker -- 3.00 packs/day for 10 years    Quit date: 11/20/1990  . Smokeless tobacco: Never Used  . Alcohol Use: Yes     Comment: occasional  . Drug Use: No  . Sexual Activity: Yes   Other Topics Concern  . Not on file   Social History Narrative  . No narrative on file    Family History  Problem Relation Age of Onset  . Cancer Mother     metastatic breast ca  . Allergies Mother   . Diabetes Father   . Other Father     renal failure/ CHF  . Coronary artery disease Father     s/p MI  . Diabetes Sister     Type 1  . Coronary artery disease Sister     s/p stents  . Heart disease Sister   .  Obesity Brother   . Other Brother     Sport and exercise psychologist  . Asthma Daughter   . Other Daughter     back disease  . Coronary artery disease Maternal Grandmother   . Diabetes Brother   . Heart disease Brother     s/p CABG  . Diabetes Brother     diet and oral meds  . Hypertension Brother   . Kidney disease Daughter     in childhood  . Colon cancer Neg Hx   . Pancreatic cancer Neg Hx   . Stomach cancer Neg Hx      Review of Systems  Constitutional: Negative for fever and unexpected weight change.  HENT: Positive for congestion, postnasal drip, rhinorrhea and sinus pressure. Negative for dental problem, ear pain, nosebleeds, sneezing, sore throat and trouble swallowing.   Eyes: Negative for redness and itching.  Respiratory: Negative for cough, chest tightness, shortness of breath and wheezing.   Cardiovascular: Positive for leg swelling. Negative for  palpitations.  Gastrointestinal: Negative for nausea and vomiting.  Genitourinary: Negative for dysuria.  Musculoskeletal: Negative for joint swelling.  Skin: Negative for rash.  Neurological: Negative for headaches.  Hematological: Does not bruise/bleed easily.  Psychiatric/Behavioral: Negative for dysphoric mood. The patient is not nervous/anxious.        Objective:   Physical Exam  Gen. Pleasant, obese, in no distress, normal affect ENT - no lesions,septum deviated to left, no post nasal drip, class 2-3 airway Neck: No JVD, no thyromegaly, no carotid bruits Lungs: no use of accessory muscles, no dullness to percussion, decreased without rales or rhonchi  Cardiovascular: Rhythm regular, heart sounds  normal, no murmurs or gallops, no peripheral edema Abdomen: soft and non-tender, no hepatosplenomegaly, BS normal. Musculoskeletal: No deformities, no cyanosis or clubbing Neuro:  alert, non focal, no tremors        Assessment & Plan:

## 2013-12-25 NOTE — Assessment & Plan Note (Signed)
Trial of mucinex D daily x 4 weeks Stay on zyrtec &  nasonex spray each nare at bedtime If no better, call for ENt appt

## 2013-12-26 ENCOUNTER — Encounter: Payer: Self-pay | Admitting: Family Medicine

## 2013-12-26 MED ORDER — FLUTICASONE PROPIONATE 50 MCG/ACT NA SUSP: 2.0000 | Freq: Every day | NASAL | Status: DC

## 2013-12-31 ENCOUNTER — Telehealth: Payer: Self-pay | Admitting: Pulmonary Disease

## 2013-12-31 NOTE — Telephone Encounter (Signed)
lmtcb x1 moniqua

## 2014-01-01 ENCOUNTER — Ambulatory Visit: Payer: BC Managed Care – PPO | Admitting: Family Medicine

## 2014-01-01 NOTE — Telephone Encounter (Signed)
Spoke with [facility] and she states that this was taken care of and they do have the sleep study.

## 2014-01-14 ENCOUNTER — Encounter: Payer: Self-pay | Admitting: Family Medicine

## 2014-01-16 MED ORDER — LOSARTAN POTASSIUM 25 MG PO TABS: 25.0000 mg | ORAL_TABLET | Freq: Every day | ORAL | Status: DC

## 2014-01-16 MED ORDER — FLUTICASONE PROPIONATE 50 MCG/ACT NA SUSP: 2.0000 | Freq: Every day | NASAL | Status: DC

## 2014-02-03 ENCOUNTER — Encounter: Payer: Self-pay | Admitting: Family Medicine

## 2014-02-12 ENCOUNTER — Encounter: Payer: Self-pay | Admitting: Pulmonary Disease

## 2014-02-12 ENCOUNTER — Ambulatory Visit (INDEPENDENT_AMBULATORY_CARE_PROVIDER_SITE_OTHER): Payer: BC Managed Care – PPO | Admitting: Pulmonary Disease

## 2014-02-12 VITALS — BP 142/76 | HR 75 | Ht 72.0 in | Wt 308.0 lb

## 2014-02-12 DIAGNOSIS — G4730 Sleep apnea, unspecified: Secondary | ICD-10-CM

## 2014-02-12 NOTE — Assessment & Plan Note (Signed)
CPAP supplies will be renewed for a year Call us as needed Change from auto settings to set pr 8 cm & chk donwload in 1 mnth  Weight loss encouraged, compliance with goal of at least 4-6 hrs every night is the expectation. Advised against medications with sedative side effects Cautioned against driving when sleepy - understanding that sleepiness will vary on a day to day basis

## 2014-02-12 NOTE — Progress Notes (Signed)
   Subjective:    Patient ID: Jake Ortiz, male    DOB: 1956-05-07, 58 y.o.   MRN: [id number]  HPI  58 year old obese diabetic presents for FU of obstructive sleep apnea  PSG in 2006 [facility] falls) showed AHI 25/h , desatn to 85% corrected by CPAP 12 cm with med FF mask  He has gained 50-70 lbs since this study  Was unable to use CPAP due to sinus issues, did not tolerate nasal mask or FF - discomfort.   He has tried zyrtec, nasonex & netti pot for sinuses     02/12/2014  Chief Complaint  Patient presents with  . Follow-up    Pt reports he wears CPAP everynight x 8 hrs a night. Pt has problems with keeping left nostril clear and is working on mask fit right now.    1 mnth Download 01/2014 - avg pr 8 cm, good compliance, AHi 3/h He feels well rested, no snoring, prefers nasal pillows _has 2 types  Review of Systems neg for any significant sore throat, dysphagia, itching, sneezing, nasal congestion or excess/ purulent secretions, fever, chills, sweats, unintended wt loss, pleuritic or exertional cp, hempoptysis, orthopnea pnd or change in chronic leg swelling. Also denies presyncope, palpitations, heartburn, abdominal pain, nausea, vomiting, diarrhea or change in bowel or urinary habits, dysuria,hematuria, rash, arthralgias, visual complaints, headache, numbness weakness or ataxia.     Objective:   Physical Exam  Gen. Pleasant, obese, in no distress ENT - no lesions, no post nasal drip Neck: No JVD, no thyromegaly, no carotid bruits Lungs: no use of accessory muscles, no dullness to percussion, decreased without rales or rhonchi  Cardiovascular: Rhythm regular, heart sounds  normal, no murmurs or gallops, no peripheral edema Musculoskeletal: No deformities, no cyanosis or clubbing , no tremors       Assessment & Plan:

## 2014-02-12 NOTE — Patient Instructions (Addendum)
CPAP supplies will be renewed for a year Call us as needed You are on auto settings 10-15

## 2014-05-07 ENCOUNTER — Ambulatory Visit: Payer: BC Managed Care – PPO | Admitting: Family Medicine

## 2014-05-07 ENCOUNTER — Ambulatory Visit (INDEPENDENT_AMBULATORY_CARE_PROVIDER_SITE_OTHER): Payer: BC Managed Care – PPO | Admitting: Family Medicine

## 2014-05-07 ENCOUNTER — Encounter: Payer: Self-pay | Admitting: Family Medicine

## 2014-05-07 VITALS — BP 140/64 | HR 85 | Temp 94.0°F | Ht 72.0 in | Wt 315.1 lb

## 2014-05-07 DIAGNOSIS — M5416 Radiculopathy, lumbar region: Secondary | ICD-10-CM

## 2014-05-07 DIAGNOSIS — R609 Edema, unspecified: Secondary | ICD-10-CM

## 2014-05-07 DIAGNOSIS — G4730 Sleep apnea, unspecified: Secondary | ICD-10-CM

## 2014-05-07 DIAGNOSIS — IMO0002 Reserved for concepts with insufficient information to code with codable children: Secondary | ICD-10-CM

## 2014-05-07 DIAGNOSIS — G8929 Other chronic pain: Secondary | ICD-10-CM

## 2014-05-07 DIAGNOSIS — E119 Type 2 diabetes mellitus without complications: Secondary | ICD-10-CM

## 2014-05-07 DIAGNOSIS — J301 Allergic rhinitis due to pollen: Secondary | ICD-10-CM

## 2014-05-07 DIAGNOSIS — R350 Frequency of micturition: Secondary | ICD-10-CM

## 2014-05-07 DIAGNOSIS — E782 Mixed hyperlipidemia: Secondary | ICD-10-CM

## 2014-05-07 DIAGNOSIS — I10 Essential (primary) hypertension: Secondary | ICD-10-CM

## 2014-05-07 DIAGNOSIS — R600 Localized edema: Secondary | ICD-10-CM

## 2014-05-07 MED ORDER — FUROSEMIDE 20 MG PO TABS: ORAL_TABLET | ORAL | Status: DC

## 2014-05-07 NOTE — Progress Notes (Signed)
Pre visit review using our clinic review tool, if applicable. No additional management support is needed unless otherwise documented below in the visit note. 

## 2014-05-07 NOTE — Patient Instructions (Signed)

## 2014-05-08 LAB — URINALYSIS
Bilirubin Urine: NEGATIVE
Glucose, UA: >1000 mg/dL — AB
HGB URINE DIPSTICK: NEGATIVE
Ketones, ur: NEGATIVE mg/dL
LEUKOCYTES UA: NEGATIVE
NITRITE: NEGATIVE
PH: 6 (ref 5.0–8.0)
Protein, ur: NEGATIVE mg/dL
Specific Gravity, Urine: 1.022 (ref 1.005–1.030)
Urobilinogen, UA: 1 mg/dL (ref 0.0–1.0)

## 2014-05-08 LAB — TSH: TSH: 1.532 u[IU]/mL (ref 0.350–4.500)

## 2014-05-08 LAB — PSA: PSA: 0.95 ng/mL (ref <=4.00)

## 2014-05-09 LAB — URINE CULTURE
Colony Count: NO GROWTH
Organism ID, Bacteria: NO GROWTH

## 2014-05-17 ENCOUNTER — Encounter: Payer: Self-pay | Admitting: Family Medicine

## 2014-05-17 NOTE — Assessment & Plan Note (Signed)
Good response to Zyrtec D prn may continue the same

## 2014-05-17 NOTE — Assessment & Plan Note (Signed)
Filled out Handicap parking form for patient today

## 2014-05-17 NOTE — Assessment & Plan Note (Signed)
Using CPAP routinely 

## 2014-05-17 NOTE — Assessment & Plan Note (Signed)
hgba1c acceptable, minimize simple carbs. Increase exercise as tolerated. Continue current meds 

## 2014-05-17 NOTE — Assessment & Plan Note (Signed)
Encouraged DASH diet, decrease po intake and increase exercise as tolerated. Needs 7-8 hours of sleep nightly. Avoid trans fats, eat small, frequent meals every 4-5 hours with lean proteins, complex carbs and healthy fats. Minimize simple carbs, GMO foods. 

## 2014-05-17 NOTE — Progress Notes (Signed)
Patient ID: Jake Ortiz, male   DOB: [birth year], 58 y.o.   MRN: [id number] Jake Ortiz [id number] 1956-02-11 05/17/2014      Progress Note-Follow Up  Subjective  Chief Complaint  Chief Complaint  Patient presents with  . Follow-up    6 month    HPI  Patient is a 58 year old male in today for routine medical care. He is in today for followup. He has been fairly well. He does not some recent urinary frequency during the day but none at night. No dysuria or hematuria. No abdominal pain. Has chronic low back pain and has been acting up lately. Has had some congestion but response to Zyrtec-D. Has had some edema intermittently. Denies CP/palp/SOB/HA/congestion/fevers/GU c/o. Taking meds as prescribed Past Medical History  Diagnosis Date  . Diabetes mellitus   . Undiagnosed cardiac murmurs 01/25/2011  . SLEEP APNEA 01/25/2011  . Morbid obesity 01/25/2011  . Mixed hyperlipidemia 01/25/2011  . DM 01/25/2011  . DIVERTICULOSIS OF COLON 01/25/2011  . COLONIC POLYPS, BENIGN 01/25/2011  . BACK STRAIN, LUMBAR 01/25/2011  . ALLERGIC RHINITIS, SEASONAL 01/25/2011  . Hypocalcemia 03/04/2012  . Preventative health care 03/04/2012  . Chronic radicular low back pain 01/25/2011    Qualifier: Diagnosis of  By: Charlett Blake MD, Stacey  1. Focal annular tear of the L4-5 disc at the level of the right  neural foramen with a tiny disc protrusion compressing the right L4  nerve in the neural foramen. This is more prominent than on the  prior study.  2. 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  Follows with Dr Genevie Ann [facility]  . LAE (left atrial enlargement) 06/27/2013  . RBBB 01/25/2011    Qualifier: Diagnosis of  By: Charlett Blake MD, Erline Levine    . Personal history of colonic polyps- sessile serrated adenoma 12/21/2006  . Cough 11/10/2013  . Pain in right axilla 11/10/2013    Past Surgical History  Procedure Laterality Date  . Tonsillectomy    . Cholecystectomy     . Skin biopsy on abdomen      unknown results    Family History  Problem Relation Age of Onset  . Cancer Mother     metastatic breast ca  . Allergies Mother   . Diabetes Father   . Other Father     renal failure/ CHF  . Coronary artery disease Father     s/p MI  . Diabetes Sister     Type 1  . Coronary artery disease Sister     s/p stents  . Heart disease Sister   . Obesity Brother   . Other Brother     Sport and exercise psychologist  . Asthma Daughter   . Other Daughter     back disease  . Coronary artery disease Maternal Grandmother   . Diabetes Brother   . Heart disease Brother     s/p CABG  . Diabetes Brother     diet and oral meds  . Hypertension Brother   . Kidney disease Daughter     in childhood  . Colon cancer Neg Hx   . Pancreatic cancer Neg Hx   . Stomach cancer Neg Hx     History   Social History  . Marital Status: Married    Spouse Name: N/A    Number of Children: 4  . Years of Education: N/A   Occupational History  . retired    Science writer  History Main Topics  . Smoking status: Former Smoker -- 3.00 packs/day for 10 years    Quit date: 11/20/1990  . Smokeless tobacco: Never Used  . Alcohol Use: Yes     Comment: occasional  . Drug Use: No  . Sexual Activity: Yes   Other Topics Concern  . Not on file   Social History Narrative  . No narrative on file    Current Outpatient Prescriptions on File Prior to Visit  Medication Sig Dispense Refill  . aspirin 325 MG tablet Take 325 mg by mouth daily.        Marland Kitchen atorvastatin (LIPITOR) 10 MG tablet Take 10 mg by mouth daily.      . cyclobenzaprine (FLEXERIL) 10 MG tablet Take 10 mg by mouth as needed. For back pain       . fluticasone (FLONASE) 50 MCG/ACT nasal spray Place 2 sprays into both nostrils daily.  48 g  1  . HYDROcodone-acetaminophen (NORCO) 10-325 MG per tablet Take 1 tablet by mouth 2 (two) times daily as needed.      . insulin aspart (NOVOLOG) 100 UNIT/ML injection Inject into the skin. Insulin  pump       . losartan (COZAAR) 25 MG tablet Take 1 tablet (25 mg total) by mouth daily.  90 tablet  1  . metFORMIN (GLUCOPHAGE) 1000 MG tablet Take 1,000 mg by mouth 2 (two) times daily.        . multivitamin (THERAGRAN) per tablet Take 1 tablet by mouth daily.         No current facility-administered medications on file prior to visit.    No Known Allergies  Review of Systems  Review of Systems  Constitutional: Negative for fever and malaise/fatigue.  HENT: Positive for congestion.   Eyes: Negative for discharge.  Respiratory: Negative for shortness of breath.   Cardiovascular: Negative for chest pain, palpitations and leg swelling.  Gastrointestinal: Negative for nausea, abdominal pain and diarrhea.  Genitourinary: Positive for frequency. Negative for dysuria.  Musculoskeletal: Positive for back pain. Negative for falls.  Skin: Negative for rash.  Neurological: Negative for loss of consciousness and headaches.  Endo/Heme/Allergies: Negative for polydipsia.  Psychiatric/Behavioral: Negative for depression and suicidal ideas. The patient is not nervous/anxious and does not have insomnia.     Objective  BP 140/64  Pulse 85  Temp(Src) 94 F (34.4 C) (Oral)  Ht 6' (1.829 m)  Wt 315 lb 1.3 oz (142.919 kg)  BMI 42.72 kg/m2  SpO2 94%  Physical Exam  Physical Exam  Constitutional: He is oriented to person, place, and time and well-developed, well-nourished, and in no distress. No distress.  HENT:  Head: Normocephalic and atraumatic.  Eyes: Conjunctivae are normal.  Neck: Neck supple. No thyromegaly present.  Cardiovascular: Normal rate, regular rhythm and normal heart sounds.   No murmur heard. Pulmonary/Chest: Effort normal and breath sounds normal. No respiratory distress.  Abdominal: He exhibits no distension and no mass. There is no tenderness.  Musculoskeletal: He exhibits no edema.  Neurological: He is alert and oriented to person, place, and time.  Skin: Skin is  warm.  Psychiatric: Memory, affect and judgment normal.    Lab Results  Component Value Date   TSH 1.532 05/07/2014   No results found for this basename: WBC, HGB, HCT, MCV, PLT   Lab Results  Component Value Date   CREATININE 0.8 03/04/2012   BUN 10 03/04/2012   NA 137 03/04/2012   K 4.1 03/04/2012   CL 103  03/04/2012   CO2 26 03/04/2012     Assessment & Plan  Chronic radicular low back pain Filled out Handicap parking form for patient today  DM hgba1c acceptable, minimize simple carbs. Increase exercise as tolerated. Continue current meds  MORBID OBESITY Encouraged DASH diet, decrease po intake and increase exercise as tolerated. Needs 7-8 hours of sleep nightly. Avoid trans fats, eat small, frequent meals every 4-5 hours with lean proteins, complex carbs and healthy fats. Minimize simple carbs, GMO foods.  Mixed hyperlipidemia Tolerating statin, encouraged heart healthy diet, avoid trans fats, minimize simple carbs and saturated fats. Increase exercise as tolerated  SLEEP APNEA Using CPAP routinely  ALLERGIC RHINITIS, SEASONAL Good response to Zyrtec D prn may continue the same

## 2014-05-17 NOTE — Assessment & Plan Note (Signed)
Tolerating statin, encouraged heart healthy diet, avoid trans fats, minimize simple carbs and saturated fats. Increase exercise as tolerated 

## 2014-05-29 ENCOUNTER — Encounter: Payer: Self-pay | Admitting: Pulmonary Disease

## 2014-05-29 DIAGNOSIS — G4733 Obstructive sleep apnea (adult) (pediatric): Secondary | ICD-10-CM

## 2014-06-29 ENCOUNTER — Telehealth: Payer: Self-pay

## 2014-06-29 DIAGNOSIS — E119 Type 2 diabetes mellitus without complications: Secondary | ICD-10-CM

## 2014-06-29 DIAGNOSIS — E782 Mixed hyperlipidemia: Secondary | ICD-10-CM

## 2014-06-29 NOTE — Telephone Encounter (Signed)
Diabetic Bundle:  Mychart message sent and labs ordered.  BP was 140/64 on 05-07-14. Ok until next visit

## 2014-07-02 ENCOUNTER — Other Ambulatory Visit: Payer: Self-pay

## 2014-07-03 MED ORDER — FLUTICASONE PROPIONATE 50 MCG/ACT NA SUSP: 2.0000 | Freq: Every day | NASAL | Status: DC

## 2014-07-03 NOTE — Telephone Encounter (Signed)
Rx sent to pharmacy. LDM 

## 2014-07-17 ENCOUNTER — Other Ambulatory Visit: Payer: Self-pay | Admitting: Family Medicine

## 2014-09-26 ENCOUNTER — Other Ambulatory Visit: Payer: Self-pay | Admitting: Family Medicine

## 2014-09-27 ENCOUNTER — Other Ambulatory Visit: Payer: Self-pay | Admitting: Family Medicine

## 2014-10-19 ENCOUNTER — Encounter: Payer: Self-pay | Admitting: Family Medicine

## 2014-11-02 ENCOUNTER — Telehealth: Payer: Self-pay | Admitting: Pulmonary Disease

## 2014-11-02 DIAGNOSIS — G4730 Sleep apnea, unspecified: Secondary | ICD-10-CM

## 2014-11-02 NOTE — Telephone Encounter (Signed)
Called pt and aware order placed. Nothing further needed

## 2014-11-12 ENCOUNTER — Encounter: Payer: Self-pay | Admitting: Family Medicine

## 2014-11-12 ENCOUNTER — Ambulatory Visit (INDEPENDENT_AMBULATORY_CARE_PROVIDER_SITE_OTHER): Payer: Medicare Other | Admitting: Family Medicine

## 2014-11-12 VITALS — BP 154/76 | HR 86 | Temp 98.6°F | Ht 72.0 in | Wt 326.2 lb

## 2014-11-12 DIAGNOSIS — R002 Palpitations: Secondary | ICD-10-CM | POA: Diagnosis not present

## 2014-11-12 DIAGNOSIS — E119 Type 2 diabetes mellitus without complications: Secondary | ICD-10-CM | POA: Diagnosis not present

## 2014-11-12 DIAGNOSIS — E785 Hyperlipidemia, unspecified: Secondary | ICD-10-CM | POA: Diagnosis not present

## 2014-11-12 DIAGNOSIS — G4730 Sleep apnea, unspecified: Secondary | ICD-10-CM

## 2014-11-12 DIAGNOSIS — E669 Obesity, unspecified: Secondary | ICD-10-CM

## 2014-11-12 DIAGNOSIS — B353 Tinea pedis: Secondary | ICD-10-CM

## 2014-11-12 DIAGNOSIS — Z0000 Encounter for general adult medical examination without abnormal findings: Secondary | ICD-10-CM | POA: Diagnosis not present

## 2014-11-12 DIAGNOSIS — E782 Mixed hyperlipidemia: Secondary | ICD-10-CM | POA: Diagnosis not present

## 2014-11-12 DIAGNOSIS — E1169 Type 2 diabetes mellitus with other specified complication: Secondary | ICD-10-CM

## 2014-11-12 DIAGNOSIS — K760 Fatty (change of) liver, not elsewhere classified: Secondary | ICD-10-CM

## 2014-11-12 LAB — LIPID PANEL
CHOLESTEROL: 142 mg/dL (ref 0–200)
HDL: 40.7 mg/dL (ref >39.00)
LDL Cholesterol: 70 mg/dL (ref 0–99)
NonHDL: 101.3
Total CHOL/HDL Ratio: 3
Triglycerides: 159 mg/dL — ABNORMAL HIGH (ref 0.0–149.0)
VLDL: 31.8 mg/dL (ref 0.0–40.0)

## 2014-11-12 LAB — RENAL FUNCTION PANEL
ALBUMIN: 4.2 g/dL (ref 3.5–5.2)
BUN: 10 mg/dL (ref 6–23)
CHLORIDE: 105 meq/L (ref 96–112)
CO2: 26 mEq/L (ref 19–32)
Calcium: 9.1 mg/dL (ref 8.4–10.5)
Creatinine, Ser: 0.8 mg/dL (ref 0.4–1.5)
GFR: 107.79 mL/min (ref >60.00)
Glucose, Bld: 93 mg/dL (ref 70–99)
PHOSPHORUS: 3.4 mg/dL (ref 2.3–4.6)
Potassium: 4 mEq/L (ref 3.5–5.1)
Sodium: 139 mEq/L (ref 135–145)

## 2014-11-12 LAB — HEPATIC FUNCTION PANEL
ALT: 56 U/L — ABNORMAL HIGH (ref 0–53)
AST: 67 U/L — AB (ref 0–37)
Albumin: 4.2 g/dL (ref 3.5–5.2)
Alkaline Phosphatase: 53 U/L (ref 39–117)
BILIRUBIN DIRECT: 0.2 mg/dL (ref 0.0–0.3)
Total Bilirubin: 1 mg/dL (ref 0.2–1.2)
Total Protein: 7.4 g/dL (ref 6.0–8.3)

## 2014-11-12 LAB — CBC
HEMATOCRIT: 41.9 % (ref 39.0–52.0)
Hemoglobin: 12.9 g/dL — ABNORMAL LOW (ref 13.0–17.0)
MCHC: 30.9 g/dL (ref 30.0–36.0)
MCV: 68.8 fl — ABNORMAL LOW (ref 78.0–100.0)
PLATELETS: 145 10*3/uL — AB (ref 150.0–400.0)
RBC: 6.09 Mil/uL — ABNORMAL HIGH (ref 4.22–5.81)
RDW: 18.1 % — AB (ref 11.5–15.5)
WBC: 6.1 10*3/uL (ref 4.0–10.5)

## 2014-11-12 LAB — TSH: TSH: 1.49 u[IU]/mL (ref 0.35–4.50)

## 2014-11-12 NOTE — Patient Instructions (Signed)
Lamisil, laendar oil white socks and blow dry  Preventive Care for Adults A healthy lifestyle and preventive care can promote health and wellness. Preventive health guidelines for men include the following key practices:  A routine yearly physical is a good way to check with your health care provider about your health and preventative screening. It is a chance to share any concerns and updates on your health and to receive a thorough exam.  Visit your dentist for a routine exam and preventative care every 6 months. Brush your teeth twice a day and floss once a day. Good oral hygiene prevents tooth decay and gum disease.  The frequency of eye exams is based on your age, health, family medical history, use of contact lenses, and other factors. Follow your health care provider's recommendations for frequency of eye exams.  Eat a healthy diet. Foods such as vegetables, fruits, whole grains, low-fat dairy products, and lean protein foods contain the nutrients you need without too many calories. Decrease your intake of foods high in solid fats, added sugars, and salt. Eat the right amount of calories for you.Get information about a proper diet from your health care provider, if necessary.  Regular physical exercise is one of the most important things you can do for your health. Most adults should get at least 150 minutes of moderate-intensity exercise (any activity that increases your heart rate and causes you to sweat) each week. In addition, most adults need muscle-strengthening exercises on 2 or more days a week.  Maintain a healthy weight. The body mass index (BMI) is a screening tool to identify possible weight problems. It provides an estimate of body fat based on height and weight. Your health care provider can find your BMI and can help you achieve or maintain a healthy weight.For adults 20 years and older:  A BMI below 18.5 is considered underweight.  A BMI of 18.5 to 24.9 is normal.  A  BMI of 25 to 29.9 is considered overweight.  A BMI of 30 and above is considered obese.  Maintain normal blood lipids and cholesterol levels by exercising and minimizing your intake of saturated fat. Eat a balanced diet with plenty of fruit and vegetables. Blood tests for lipids and cholesterol should begin at age 39 and be repeated every 5 years. If your lipid or cholesterol levels are high, you are over 50, or you are at high risk for heart disease, you may need your cholesterol levels checked more frequently.Ongoing high lipid and cholesterol levels should be treated with medicines if diet and exercise are not working.  If you smoke, find out from your health care provider how to quit. If you do not use tobacco, do not start.  Lung cancer screening is recommended for adults aged 48-80 years who are at high risk for developing lung cancer because of a history of smoking. A yearly low-dose CT scan of the lungs is recommended for people who have at least a 30-pack-year history of smoking and are a current smoker or have quit within the past 15 years. A pack year of smoking is smoking an average of 1 pack of cigarettes a day for 1 year (for example: 1 pack a day for 30 years or 2 packs a day for 15 years). Yearly screening should continue until the smoker has stopped smoking for at least 15 years. Yearly screening should be stopped for people who develop a health problem that would prevent them from having lung cancer treatment.  If you  choose to drink alcohol, do not have more than 2 drinks per day. One drink is considered to be 12 ounces (355 mL) of beer, 5 ounces (148 mL) of wine, or 1.5 ounces (44 mL) of liquor.  Avoid use of street drugs. Do not share needles with anyone. Ask for help if you need support or instructions about stopping the use of drugs.  High blood pressure causes heart disease and increases the risk of stroke. Your blood pressure should be checked at least every 1-2 years. Ongoing  high blood pressure should be treated with medicines, if weight loss and exercise are not effective.  If you are 78-10 years old, ask your health care provider if you should take aspirin to prevent heart disease.  Diabetes screening involves taking a blood sample to check your fasting blood sugar level. This should be done once every 3 years, after age 85, if you are within normal weight and without risk factors for diabetes. Testing should be considered at a younger age or be carried out more frequently if you are overweight and have at least 1 risk factor for diabetes.  Colorectal cancer can be detected and often prevented. Most routine colorectal cancer screening begins at the age of 32 and continues through age 62. However, your health care provider may recommend screening at an earlier age if you have risk factors for colon cancer. On a yearly basis, your health care provider may provide home test kits to check for hidden blood in the stool. Use of a small camera at the end of a tube to directly examine the colon (sigmoidoscopy or colonoscopy) can detect the earliest forms of colorectal cancer. Talk to your health care provider about this at age 79, when routine screening begins. Direct exam of the colon should be repeated every 5-10 years through age 8, unless early forms of precancerous polyps or small growths are found.  People who are at an increased risk for hepatitis B should be screened for this virus. You are considered at high risk for hepatitis B if:  You were born in a country where hepatitis B occurs often. Talk with your health care provider about which countries are considered high risk.  Your parents were born in a high-risk country and you have not received a shot to protect against hepatitis B (hepatitis B vaccine).  You have HIV or AIDS.  You use needles to inject street drugs.  You live with, or have sex with, someone who has hepatitis B.  You are a man who has sex with  other men (MSM).  You get hemodialysis treatment.  You take certain medicines for conditions such as cancer, organ transplantation, and autoimmune conditions.  Hepatitis C blood testing is recommended for all people born from [birth year] through 1965 and any individual with known risks for hepatitis C.  Practice safe sex. Use condoms and avoid high-risk sexual practices to reduce the spread of sexually transmitted infections (STIs). STIs include gonorrhea, chlamydia, syphilis, trichomonas, herpes, HPV, and human immunodeficiency virus (HIV). Herpes, HIV, and HPV are viral illnesses that have no cure. They can result in disability, cancer, and death.  If you are at risk of being infected with HIV, it is recommended that you take a prescription medicine daily to prevent HIV infection. This is called preexposure prophylaxis (PrEP). You are considered at risk if:  You are a man who has sex with other men (MSM) and have other risk factors.  You are a heterosexual man, are sexually  active, and are at increased risk for HIV infection.  You take drugs by injection.  You are sexually active with a partner who has HIV.  Talk with your health care provider about whether you are at high risk of being infected with HIV. If you choose to begin PrEP, you should first be tested for HIV. You should then be tested every 3 months for as long as you are taking PrEP.  A one-time screening for abdominal aortic aneurysm (AAA) and surgical repair of large AAAs by ultrasound are recommended for men ages 13 to 70 years who are current or former smokers.  Healthy men should no longer receive prostate-specific antigen (PSA) blood tests as part of routine cancer screening. Talk with your health care provider about prostate cancer screening.  Testicular cancer screening is not recommended for adult males who have no symptoms. Screening includes self-exam, a health care provider exam, and other screening tests. Consult with  your health care provider about any symptoms you have or any concerns you have about testicular cancer.  Use sunscreen. Apply sunscreen liberally and repeatedly throughout the day. You should seek shade when your shadow is shorter than you. Protect yourself by wearing long sleeves, pants, a wide-brimmed hat, and sunglasses year round, whenever you are outdoors.  Once a month, do a whole-body skin exam, using a mirror to look at the skin on your back. Tell your health care provider about new moles, moles that have irregular borders, moles that are larger than a pencil eraser, or moles that have changed in shape or color.  Stay current with required vaccines (immunizations).  Influenza vaccine. All adults should be immunized every year.  Tetanus, diphtheria, and acellular pertussis (Td, Tdap) vaccine. An adult who has not previously received Tdap or who does not know his vaccine status should receive 1 dose of Tdap. This initial dose should be followed by tetanus and diphtheria toxoids (Td) booster doses every 10 years. Adults with an unknown or incomplete history of completing a 3-dose immunization series with Td-containing vaccines should begin or complete a primary immunization series including a Tdap dose. Adults should receive a Td booster every 10 years.  Varicella vaccine. An adult without evidence of immunity to varicella should receive 2 doses or a second dose if he has previously received 1 dose.  Human papillomavirus (HPV) vaccine. Males aged 13-21 years who have not received the vaccine previously should receive the 3-dose series. Males aged 22-26 years may be immunized. Immunization is recommended through the age of 71 years for any male who has sex with males and did not get any or all doses earlier. Immunization is recommended for any person with an immunocompromised condition through the age of 35 years if he did not get any or all doses earlier. During the 3-dose series, the second dose  should be obtained 4-8 weeks after the first dose. The third dose should be obtained 24 weeks after the first dose and 16 weeks after the second dose.  Zoster vaccine. One dose is recommended for adults aged 67 years or older unless certain conditions are present.  Measles, mumps, and rubella (MMR) vaccine. Adults born before [birth year] generally are considered immune to measles and mumps. Adults born in [birth year] or later should have 1 or more doses of MMR vaccine unless there is a contraindication to the vaccine or there is laboratory evidence of immunity to each of the three diseases. A routine second dose of MMR vaccine should be obtained at least  28 days after the first dose for students attending postsecondary schools, health care workers, or international travelers. People who received inactivated measles vaccine or an unknown type of measles vaccine during 1963-1967 should receive 2 doses of MMR vaccine. People who received inactivated mumps vaccine or an unknown type of mumps vaccine before 1979 and are at high risk for mumps infection should consider immunization with 2 doses of MMR vaccine. Unvaccinated health care workers born before [birth year] who lack laboratory evidence of measles, mumps, or rubella immunity or laboratory confirmation of disease should consider measles and mumps immunization with 2 doses of MMR vaccine or rubella immunization with 1 dose of MMR vaccine.  Pneumococcal 13-valent conjugate (PCV13) vaccine. When indicated, a person who is uncertain of his immunization history and has no record of immunization should receive the PCV13 vaccine. An adult aged 78 years or older who has certain medical conditions and has not been previously immunized should receive 1 dose of PCV13 vaccine. This PCV13 should be followed with a dose of pneumococcal polysaccharide (PPSV23) vaccine. The PPSV23 vaccine dose should be obtained at least 8 weeks after the dose of PCV13 vaccine. An adult aged 28 years or older  who has certain medical conditions and previously received 1 or more doses of PPSV23 vaccine should receive 1 dose of PCV13. The PCV13 vaccine dose should be obtained 1 or more years after the last PPSV23 vaccine dose.  Pneumococcal polysaccharide (PPSV23) vaccine. When PCV13 is also indicated, PCV13 should be obtained first. All adults aged 55 years and older should be immunized. An adult younger than age 30 years who has certain medical conditions should be immunized. Any person who resides in a nursing home or long-term care facility should be immunized. An adult smoker should be immunized. People with an immunocompromised condition and certain other conditions should receive both PCV13 and PPSV23 vaccines. People with human immunodeficiency virus (HIV) infection should be immunized as soon as possible after diagnosis. Immunization during chemotherapy or radiation therapy should be avoided. Routine use of PPSV23 vaccine is not recommended for American Indians, [address], or people younger than 65 years unless there are medical conditions that require PPSV23 vaccine. When indicated, people who have unknown immunization and have no record of immunization should receive PPSV23 vaccine. One-time revaccination 5 years after the first dose of PPSV23 is recommended for people aged 19-64 years who have chronic kidney failure, nephrotic syndrome, asplenia, or immunocompromised conditions. People who received 1-2 doses of PPSV23 before age 62 years should receive another dose of PPSV23 vaccine at age 77 years or later if at least 5 years have passed since the previous dose. Doses of PPSV23 are not needed for people immunized with PPSV23 at or after age 7 years.  Meningococcal vaccine. Adults with asplenia or persistent complement component deficiencies should receive 2 doses of quadrivalent meningococcal conjugate (MenACWY-D) vaccine. The doses should be obtained at least 2 months apart. Microbiologists working  with certain meningococcal bacteria, military recruits, people at risk during an outbreak, and people who travel to or live in countries with a high rate of meningitis should be immunized. A first-year college student up through age 62 years who is living in a residence hall should receive a dose if he did not receive a dose on or after his 16th birthday. Adults who have certain high-risk conditions should receive one or more doses of vaccine.  Hepatitis A vaccine. Adults who wish to be protected from this disease, have certain high-risk conditions, work with  hepatitis A-infected animals, work in hepatitis A research labs, or travel to or work in countries with a high rate of hepatitis A should be immunized. Adults who were previously unvaccinated and who anticipate close contact with an international adoptee during the first 60 days after arrival in the [place] from a country with a high rate of hepatitis A should be immunized.  Hepatitis B vaccine. Adults should be immunized if they wish to be protected from this disease, have certain high-risk conditions, may be exposed to blood or other infectious body fluids, are household contacts or sex partners of hepatitis B positive people, are clients or workers in certain care facilities, or travel to or work in countries with a high rate of hepatitis B.  Haemophilus influenzae type b (Hib) vaccine. A previously unvaccinated person with asplenia or sickle cell disease or having a scheduled splenectomy should receive 1 dose of Hib vaccine. Regardless of previous immunization, a recipient of a hematopoietic stem cell transplant should receive a 3-dose series 6-12 months after his successful transplant. Hib vaccine is not recommended for adults with HIV infection. Preventive Service / Frequency Ages 28 to 44  Blood pressure check.** / Every 1 to 2 years.  Lipid and cholesterol check.** / Every 5 years beginning at age 75.  Hepatitis C blood test.** / For  any individual with known risks for hepatitis C.  Skin self-exam. / Monthly.  Influenza vaccine. / Every year.  Tetanus, diphtheria, and acellular pertussis (Tdap, Td) vaccine.** / Consult your health care provider. 1 dose of Td every 10 years.  Varicella vaccine.** / Consult your health care provider.  HPV vaccine. / 3 doses over 6 months, if 81 or younger.  Measles, mumps, rubella (MMR) vaccine.** / You need at least 1 dose of MMR if you were born in 1957 or later. You may also need a second dose.  Pneumococcal 13-valent conjugate (PCV13) vaccine.** / Consult your health care provider.  Pneumococcal polysaccharide (PPSV23) vaccine.** / 1 to 2 doses if you smoke cigarettes or if you have certain conditions.  Meningococcal vaccine.** / 1 dose if you are age 47 to 6 years and a Market researcher living in a residence hall, or have one of several medical conditions. You may also need additional booster doses.  Hepatitis A vaccine.** / Consult your health care provider.  Hepatitis B vaccine.** / Consult your health care provider.  Haemophilus influenzae type b (Hib) vaccine.** / Consult your health care provider. Ages 21 to 46  Blood pressure check.** / Every 1 to 2 years.  Lipid and cholesterol check.** / Every 5 years beginning at age 58.  Lung cancer screening. / Every year if you are aged 32-80 years and have a 30-pack-year history of smoking and currently smoke or have quit within the past 15 years. Yearly screening is stopped once you have quit smoking for at least 15 years or develop a health problem that would prevent you from having lung cancer treatment.  Fecal occult blood test (FOBT) of stool. / Every year beginning at age 39 and continuing until age 30. You may not have to do this test if you get a colonoscopy every 10 years.  Flexible sigmoidoscopy** or colonoscopy.** / Every 5 years for a flexible sigmoidoscopy or every 10 years for a colonoscopy beginning at  age 51 and continuing until age 3.  Hepatitis C blood test.** / For all people born from [birth year] through 1967 and any individual with known risks for hepatitis C.  Skin self-exam. / Monthly.  Influenza vaccine. / Every year.  Tetanus, diphtheria, and acellular pertussis (Tdap/Td) vaccine.** / Consult your health care provider. 1 dose of Td every 10 years.  Varicella vaccine.** / Consult your health care provider.  Zoster vaccine.** / 1 dose for adults aged 10 years or older.  Measles, mumps, rubella (MMR) vaccine.** / You need at least 1 dose of MMR if you were born in 1959 or later. You may also need a second dose.  Pneumococcal 13-valent conjugate (PCV13) vaccine.** / Consult your health care provider.  Pneumococcal polysaccharide (PPSV23) vaccine.** / 1 to 2 doses if you smoke cigarettes or if you have certain conditions.  Meningococcal vaccine.** / Consult your health care provider.  Hepatitis A vaccine.** / Consult your health care provider.  Hepatitis B vaccine.** / Consult your health care provider.  Haemophilus influenzae type b (Hib) vaccine.** / Consult your health care provider. Ages 59 and over  Blood pressure check.** / Every 1 to 2 years.  Lipid and cholesterol check.**/ Every 5 years beginning at age 53.  Lung cancer screening. / Every year if you are aged 44-80 years and have a 30-pack-year history of smoking and currently smoke or have quit within the past 15 years. Yearly screening is stopped once you have quit smoking for at least 15 years or develop a health problem that would prevent you from having lung cancer treatment.  Fecal occult blood test (FOBT) of stool. / Every year beginning at age 50 and continuing until age 81. You may not have to do this test if you get a colonoscopy every 10 years.  Flexible sigmoidoscopy** or colonoscopy.** / Every 5 years for a flexible sigmoidoscopy or every 10 years for a colonoscopy beginning at age 51 and continuing until  age 52.  Hepatitis C blood test.** / For all people born from [birth year] through 1967 and any individual with known risks for hepatitis C.  Abdominal aortic aneurysm (AAA) screening.** / A one-time screening for ages 53 to 108 years who are current or former smokers.  Skin self-exam. / Monthly.  Influenza vaccine. / Every year.  Tetanus, diphtheria, and acellular pertussis (Tdap/Td) vaccine.** / 1 dose of Td every 10 years.  Varicella vaccine.** / Consult your health care provider.  Zoster vaccine.** / 1 dose for adults aged 32 years or older.  Pneumococcal 13-valent conjugate (PCV13) vaccine.** / Consult your health care provider.  Pneumococcal polysaccharide (PPSV23) vaccine.** / 1 dose for all adults aged 72 years and older.  Meningococcal vaccine.** / Consult your health care provider.  Hepatitis A vaccine.** / Consult your health care provider.  Hepatitis B vaccine.** / Consult your health care provider.  Haemophilus influenzae type b (Hib) vaccine.** / Consult your health care provider. **Family history and personal history of risk and conditions may change your health care provider's recommendations. Document Released: 01/02/2002 Document Revised: 11/11/2013 Document Reviewed: 04/03/2011 [facility] Patient Information 2015 [place], [place]. This information is not intended to replace advice given to you by your health care provider. Make sure you discuss any questions you have with your health care provider.

## 2014-11-12 NOTE — Progress Notes (Signed)
Pre visit review using our clinic review tool, if applicable. No additional management support is needed unless otherwise documented below in the visit note. 

## 2014-11-14 ENCOUNTER — Encounter: Payer: Self-pay | Admitting: Family Medicine

## 2014-11-15 ENCOUNTER — Encounter: Payer: Self-pay | Admitting: Family Medicine

## 2014-11-15 DIAGNOSIS — B353 Tinea pedis: Secondary | ICD-10-CM

## 2014-11-15 DIAGNOSIS — I471 Supraventricular tachycardia: Secondary | ICD-10-CM | POA: Insufficient documentation

## 2014-11-15 HISTORY — DX: Tinea pedis: B35.3

## 2014-11-15 NOTE — Assessment & Plan Note (Signed)
Encouraged DASH diet, decrease po intake and increase exercise as tolerated. Needs 7-8 hours of sleep nightly. Avoid trans fats, eat small, frequent meals ever 5 hours with lean proteins, complex carbs and healthy fats. Minimize simple carbs

## 2014-11-15 NOTE — Assessment & Plan Note (Signed)
Patient encouraged to maintain heart healthy diet, regular exercise, adequate sleep. Consider daily probiotics. Take medications as prescribed 

## 2014-11-15 NOTE — Assessment & Plan Note (Signed)
With SOB and numerous risk factors is referred to cardiology for consideration

## 2014-11-15 NOTE — Assessment & Plan Note (Signed)
Encouraged soaks in warm water and vinegar, Lamisil topically bid and powder in shoes, change white cotton socks twice a day

## 2014-11-15 NOTE — Assessment & Plan Note (Signed)
Reports most recent A1C 6.5 [id number] acceptable, minimize simple carbs. Increase exercise as tolerated. Continue current meds

## 2014-11-15 NOTE — Assessment & Plan Note (Signed)
Liver functions elevated, minimize simple carbs and fatty foods

## 2014-11-15 NOTE — Assessment & Plan Note (Signed)
Uses CPAP every night 

## 2014-11-15 NOTE — Progress Notes (Signed)
Jake Ortiz  [id number] 1956/08/07 11/15/2014      Progress Note-Follow Up  Subjective  Chief Complaint  Chief Complaint  Patient presents with  . Annual Exam    fasting    HPI  Patient is a 58 y.o. male in today for routine medical care. He is in today for annual exam accompanied by his wife. He is acknowledging a recent school an episode recently which lasted 15 minutes also notes shortness of breath and anxiety occurring as well.. Notes it occurs when he's stressed about once every 1-2 weeks. Continues to follow with endocrinology in [place]. Has had a couple weeks worth of head congestion and headache as well. Zyrtec has been marginally helpful. Complaining of itchy scaly feet.  Past Medical History  Diagnosis Date  . Diabetes mellitus   . Undiagnosed cardiac murmurs 01/25/2011  . SLEEP APNEA 01/25/2011  . Morbid obesity 01/25/2011  . Mixed hyperlipidemia 01/25/2011  . DM 01/25/2011  . DIVERTICULOSIS OF COLON 01/25/2011  . COLONIC POLYPS, BENIGN 01/25/2011  . BACK STRAIN, LUMBAR 01/25/2011  . ALLERGIC RHINITIS, SEASONAL 01/25/2011  . Hypocalcemia 03/04/2012  . Preventative health care 03/04/2012  . Chronic radicular low back pain 01/25/2011    Qualifier: Diagnosis of  By: Charlett Blake MD, Stacey  1. Focal annular tear of the L4-5 disc at the level of the right  neural foramen with a tiny disc protrusion compressing the right L4  nerve in the neural foramen. This is more prominent than on the  prior study.  2. 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  Follow[facility]evie [facility]  . LAE (left atrial enlargement) 06/27/2013  . RBBB 01/25/2011    Qualifier: Diagnosis of  By: Charlett Blake MD, Erline Levine    . Personal history of colonic polyps- sessile serrated adenoma 12/21/2006  . Cough 11/10/2013  . Pain in right axilla 11/10/2013  . Tinea pedis 11/15/2014    Past Surgical History  Procedure Laterality Date  . Tonsillectomy    .  Cholecystectomy    . Skin biopsy on abdomen      unknown results    Family History  Problem Relation Age of Onset  . Cancer Mother     metastatic breast ca  . Allergies Mother   . Diabetes Father   . Other Father     renal failure/ CHF  . Coronary artery disease Father     s/p MI  . Diabetes Sister     Type 1  . Coronary artery disease Sister     s/p stents  . Heart disease Sister   . Obesity Brother   . Other Brother     Sport and exercise psychologist  . Asthma Daughter   . Other Daughter     back disease  . Coronary artery disease Maternal Grandmother   . Diabetes Brother   . Heart disease Brother     s/p CABG  . Diabetes Brother     diet and oral meds  . Hypertension Brother   . Kidney disease Daughter     in childhood  . Colon cancer Neg Hx   . Pancreatic cancer Neg Hx   . Stomach cancer Neg Hx     History   Social History  . Marital Status: Married    Spouse Name: N/A    Number of Children: 4  . Years of Education: N/A   Occupational History  . retired  Social History Main Topics  . Smoking status: Former Smoker -- 3.00 packs/day for 10 years    Quit date: 11/20/1990  . Smokeless tobacco: Never Used  . Alcohol Use: Yes     Comment: occasional  . Drug Use: No  . Sexual Activity: Yes   Other Topics Concern  . Not on file   Social History Narrative    Current Outpatient Prescriptions on File Prior to Visit  Medication Sig Dispense Refill  . aspirin 325 MG tablet Take 325 mg by mouth daily.      . cyclobenzaprine (FLEXERIL) 10 MG tablet Take 10 mg by mouth as needed. For back pain     . fluticasone (FLONASE) 50 MCG/ACT nasal spray USE 2 SPRAYS IN EACH NOSTRIL DAILY 48 g 1  . furosemide (LASIX) 20 MG tablet 1-2 tabs po daily prn edema, sob, weight gain>3# in 24 hours 40 tablet 2  . HUMALOG 100 UNIT/ML injection Insulin pump-sliding scale    . HYDROcodone-acetaminophen (NORCO) 10-325 MG per tablet Take 1 tablet by mouth 2 (two) times daily as needed.    Marland Kitchen  losartan (COZAAR) 25 MG tablet TAKE 1 TABLET DAILY 90 tablet 1  . metFORMIN (GLUCOPHAGE) 1000 MG tablet Take 1,000 mg by mouth 2 (two) times daily.      . multivitamin (THERAGRAN) per tablet Take 1 tablet by mouth daily.       No current facility-administered medications on file prior to visit.    No Known Allergies  Review of Systems  Review of Systems  Constitutional: Negative for fever, chills and malaise/fatigue.  HENT: Positive for congestion. Negative for hearing loss and nosebleeds.   Eyes: Negative for discharge.  Respiratory: Positive for shortness of breath. Negative for cough, sputum production and wheezing.   Cardiovascular: Positive for palpitations. Negative for chest pain and leg swelling.  Gastrointestinal: Negative for heartburn, nausea, vomiting, abdominal pain, diarrhea, constipation and blood in stool.  Genitourinary: Negative for dysuria, urgency, frequency and hematuria.  Musculoskeletal: Negative for myalgias, back pain and falls.  Skin: Positive for itching and rash.  Neurological: Positive for headaches. Negative for dizziness, tremors, sensory change, focal weakness, loss of consciousness and weakness.  Endo/Heme/Allergies: Negative for polydipsia. Does not bruise/bleed easily.  Psychiatric/Behavioral: Negative for depression and suicidal ideas. The patient is not nervous/anxious and does not have insomnia.     Objective  BP 154/76 mmHg  Pulse 86  Temp(Src) 98.6 F (37 C) (Oral)  Ht 6' (1.829 m)  Wt 326 lb 3.2 oz (147.963 kg)  BMI 44.23 kg/m2  SpO2 98%  Physical Exam  Physical Exam  Constitutional: He is oriented to person, place, and time and well-developed, well-nourished, and in no distress. No distress.  HENT:  Head: Normocephalic and atraumatic.  Eyes: Conjunctivae are normal.  Neck: Neck supple. No thyromegaly present.  Cardiovascular: Normal rate, regular rhythm and normal heart sounds.   No murmur heard. Pulmonary/Chest: Effort normal  and breath sounds normal. No respiratory distress.  Abdominal: He exhibits no distension and no mass. There is no tenderness.  Musculoskeletal: He exhibits no edema.  Neurological: He is alert and oriented to person, place, and time.  Skin: Skin is warm.  Skin on feet, thick, scaly cracked  Psychiatric: Memory, affect and judgment normal.    Lab Results  Component Value Date   TSH 1.49 11/12/2014   Lab Results  Component Value Date   WBC 6.1 11/12/2014   HGB 12.9* 11/12/2014   HCT 41.9 11/12/2014   MCV  68.8 Repeated and verified X2.* 11/12/2014   PLT 145.0* 11/12/2014   Lab Results  Component Value Date   CREATININE 0.8 11/12/2014   BUN 10 11/12/2014   NA 139 11/12/2014   K 4.0 11/12/2014   CL 105 11/12/2014   CO2 26 11/12/2014   Lab Results  Component Value Date   ALT 56* 11/12/2014   AST 67* 11/12/2014   ALKPHOS 53 11/12/2014   BILITOT 1.0 11/12/2014   Lab Results  Component Value Date   CHOL 142 11/12/2014   Lab Results  Component Value Date   HDL 40.70 11/12/2014   Lab Results  Component Value Date   LDLCALC 70 11/12/2014   Lab Results  Component Value Date   TRIG 159.0* 11/12/2014   Lab Results  Component Value Date   CHOLHDL 3 11/12/2014     Assessment & Plan  Fatty liver disease, nonalcoholic Liver functions elevated, minimize simple carbs and fatty foods  Diabetes mellitus type 2 in obese Reports most recent A1C 6.5 hgba1c acceptable, minimize simple carbs. Increase exercise as tolerated. Continue current meds  Tinea pedis Encouraged soaks in warm water and vinegar, Lamisil topically bid and powder in shoes, change white cotton socks twice a day  Palpitations With SOB and numerous risk factors is referred to cardiology for consideration  MORBID OBESITY Encouraged DASH diet, decrease po intake and increase exercise as tolerated. Needs 7-8 hours of sleep nightly. Avoid trans fats, eat small, frequent meals ever 5 hours with lean  proteins, complex carbs and healthy fats. Minimize simple carbs  Mixed hyperlipidemia Tolerating statin, encouraged heart healthy diet, avoid trans fats, minimize simple carbs and saturated fats. Increase exercise as tolerated  Sleep apnea Uses CPAP every night  Preventative health care Patient encouraged to maintain heart healthy diet, regular exercise, adequate sleep. Consider daily probiotics. Take medications as prescribed

## 2014-11-15 NOTE — Assessment & Plan Note (Signed)
Tolerating statin, encouraged heart healthy diet, avoid trans fats, minimize simple carbs and saturated fats. Increase exercise as tolerated 

## 2014-11-16 ENCOUNTER — Encounter: Payer: Self-pay | Admitting: Pulmonary Disease

## 2014-11-16 NOTE — Telephone Encounter (Signed)
Papers have been printed out and faxed to [facility].  Spoke with carol and she is aware that these will be sent to her to get the pt set up for supplies for his cpap through them. Nothing further is needed.

## 2014-11-19 ENCOUNTER — Other Ambulatory Visit: Payer: Self-pay | Admitting: General Practice

## 2014-11-19 MED ORDER — FERROUS FUMARATE-FOLIC ACID 324-1 MG PO TABS: 1.0000 | ORAL_TABLET | Freq: Every day | ORAL | Status: DC

## 2014-12-10 ENCOUNTER — Ambulatory Visit: Payer: BC Managed Care – PPO | Admitting: Cardiovascular Disease

## 2014-12-25 ENCOUNTER — Ambulatory Visit (INDEPENDENT_AMBULATORY_CARE_PROVIDER_SITE_OTHER): Payer: Medicare Other | Admitting: Cardiovascular Disease

## 2014-12-25 ENCOUNTER — Encounter: Payer: Self-pay | Admitting: Cardiovascular Disease

## 2014-12-25 VITALS — BP 140/82 | HR 81 | Ht 72.0 in | Wt 327.8 lb

## 2014-12-25 DIAGNOSIS — R002 Palpitations: Secondary | ICD-10-CM | POA: Diagnosis not present

## 2014-12-25 NOTE — Patient Instructions (Addendum)
Your physician has requested that you have an echocardiogram. Echocardiography is a painless test that uses sound waves to create images of your heart. It provides your doctor with information about the size and shape of your heart and how well your heart's chambers and valves are working. This procedure takes approximately one hour. There are no restrictions for this procedure.  Your physician has recommended that you wear an event monitor. Event monitors are medical devices that record the heart's electrical activity. Doctors most often [place] these monitors to diagnose arrhythmias. Arrhythmias are problems with the speed or rhythm of the heartbeat. The monitor is a small, portable device. You can wear one while you do your normal daily activities. This is usually used to diagnose what is causing palpitations/syncope (passing out).   Your physician recommends that you continue on your current medications as directed. Please refer to the Current Medication list given to you today.  Your physician recommends that you schedule a follow-up appointment in: 2-3 months with Dr. Acie Fredrickson   For your  leg edema you  should do  the following 1. Leg elevation - I recommend the Lounge Dr. Leg rest.  See below for details  2. Salt restriction  -  Use potassium chloride instead of regular salt as a salt substitute. 3. Walk regularly 4. Compression hose - [facility] 5. Weight loss     Go to [email or web address]

## 2014-12-25 NOTE — Progress Notes (Signed)
Cardiology Office Note   Date:  12/25/2014   ID:  Jake Ortiz, DOB January 13, 1956, MRN [id number]  PCP:  Penni Homans, MD  Cardiologist:   Thayer Headings, MD   Chief Complaint  Patient presents with  . Palpitations   Problem list: 1. Diabetes mellitus 2. Morbid obesity 3. Hyperlipidemia 4. Obstructive sleep apnea   History of Present Illness: Jake Ortiz is a 60 y.o. male who presents for palpitations.  Has been having palpitations for year.   The palpitations feels like a flutter in his chest .  HR of 150 on occasion ( measured by his BP cuff)  Not related to eating or drinking . Or exercise No associated symptoms of CP or pre-syncope, no chest pressure.  Able to do his typical activity through the palpitations. Last several minutes - 10 minutes. Wife is a Marine scientist, she thinks it is irregular - like atrial fib.   Does not get any regular exercise,  Slowly getting back into working out. Does not have any CP or dyspnea when he goes for a walk.  Retired, was an Clinical biochemist.    Past Medical History  Diagnosis Date  . Diabetes mellitus   . Undiagnosed cardiac murmurs 01/25/2011  . SLEEP APNEA 01/25/2011  . Morbid obesity 01/25/2011  . Mixed hyperlipidemia 01/25/2011  . DM 01/25/2011  . DIVERTICULOSIS OF COLON 01/25/2011  . COLONIC POLYPS, BENIGN 01/25/2011  . BACK STRAIN, LUMBAR 01/25/2011  . ALLERGIC RHINITIS, SEASONAL 01/25/2011  . Hypocalcemia 03/04/2012  . Preventative health care 03/04/2012  . Chronic radicular low back pain 01/25/2011    Qualifier: Diagnosis of  By: Charlett Blake MD, Stacey  1. Focal annular tear of the L4-5 disc at the level of the right  neural foramen with a tiny disc protrusion compressing the right L4  nerve in the neural foramen. This is more prominent than on the  prior study.  2. 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  [facility]  . LAE (left atrial enlargement)  06/27/2013  . RBBB 01/25/2011    Qualifier: Diagnosis of  By: Charlett Blake MD, Erline Levine    . Personal history of colonic polyps- sessile serrated adenoma 12/21/2006  . Cough 11/10/2013  . Pain in right axilla 11/10/2013  . Tinea pedis 11/15/2014    Past Surgical History  Procedure Laterality Date  . Tonsillectomy    . Cholecystectomy    . Skin biopsy on abdomen      unknown results     Current Outpatient Prescriptions  Medication Sig Dispense Refill  . aspirin 325 MG tablet Take 325 mg by mouth daily.      Marland Kitchen atorvastatin (LIPITOR) 20 MG tablet Take 20 mg by mouth daily.    . Cholecalciferol (VITAMIN D-3 PO) Take 1 capsule by mouth daily.    . cyclobenzaprine (FLEXERIL) 10 MG tablet Take 10 mg by mouth as needed. For back pain     . fluticasone (FLONASE) 50 MCG/ACT nasal spray USE 2 SPRAYS IN EACH NOSTRIL DAILY 48 g 1  . HUMALOG 100 UNIT/ML injection Insulin pump-sliding scale    . HYDROcodone-acetaminophen (NORCO) 10-325 MG per tablet Take 1 tablet by mouth 2 (two) times daily as needed.    Marland Kitchen losartan (COZAAR) 25 MG tablet TAKE 1 TABLET DAILY (Patient taking differently: TAKE 1 TABLET BY MOUTH DAILY) 90 tablet 1  . magnesium oxide (MAG-OX) 400 MG tablet Take 400  mg by mouth daily.    . metFORMIN (GLUCOPHAGE) 1000 MG tablet Take 1,000 mg by mouth 2 (two) times daily.      . Omega-3 Fatty Acids (FISH OIL PO) Take 1 capsule by mouth 2 (two) times daily.      No current facility-administered medications for this visit.    Allergies:   Review of patient's allergies indicates no known allergies.    Social History:  The patient  reports that he quit smoking about 24 years ago. He has never used smokeless tobacco. He reports that he drinks alcohol. He reports that he does not use illicit drugs.   Family History:  The patient's family history includes Allergies in his mother; Asthma in his daughter; Cancer in his mother; Coronary artery disease in his father, maternal grandmother, and sister;  Diabetes in his brother, brother, father, and sister; Heart disease in his brother and sister; Hypertension in his brother; Kidney disease in his daughter; Obesity in his brother; Other in his brother, daughter, and father. There is no history of Colon cancer, Pancreatic cancer, or Stomach cancer.    ROS:  Please see the history of present illness.    Review of Systems: Constitutional:  denies fever, chills, diaphoresis, appetite change and fatigue.  HEENT: denies photophobia, eye pain, redness, hearing loss, ear pain, congestion, sore throat, rhinorrhea, sneezing, neck pain, neck stiffness and tinnitus.  Respiratory: denies SOB, DOE, cough, chest tightness, and wheezing.  Cardiovascular: denies chest pain, palpitations and leg swelling.  Gastrointestinal: denies nausea, vomiting, abdominal pain, diarrhea, constipation, blood in stool.  Genitourinary: denies dysuria, urgency, frequency, hematuria, flank pain and difficulty urinating.  Musculoskeletal: denies  myalgias, back pain, joint swelling, arthralgias and gait problem.   Skin: denies pallor, rash and wound.  Neurological: denies dizziness, seizures, syncope, weakness, light-headedness, numbness and headaches.   Hematological: denies adenopathy, easy bruising, personal or family bleeding history.  Psychiatric/ Behavioral: denies suicidal ideation, mood changes, confusion, nervousness, sleep disturbance and agitation.       All other systems are reviewed and negative.    PHYSICAL EXAM: VS:  BP 140/82 mmHg  Pulse 81  Ht 6' (1.829 m)  Wt 327 lb 12.8 oz (148.689 kg)  BMI 44.45 kg/m2 , BMI Body mass index is 44.45 kg/(m^2). GEN: Well nourished, well developed, in no acute distress, moderately obese  HEENT: normal Neck: no JVD, carotid bruits, or masses Cardiac: RRR; soft systolic  Murmur, no  rubs, or gallops, 2+ pitting edema  Respiratory:  clear to auscultation bilaterally, normal work of breathing GI: soft, nontender,  nondistended, + BS, moderately obese  MS: no deformity or atrophy Skin: warm and dry, no rash Neuro:  Strength and sensation are intact Psych: normal   EKG:  EKG is ordered today. The ekg ordered today demonstrates NSR at 81, Left ant. Hemiblock.   Recent Labs: 11/12/2014: ALT 56*; BUN 10; Creatinine 0.8; Hemoglobin 12.9*; Platelets 145.0*; Potassium 4.0; Sodium 139; TSH 1.49    Lipid Panel    Component Value Date/Time   CHOL 142 11/12/2014 1057   TRIG 159.0* 11/12/2014 1057   HDL 40.70 11/12/2014 1057   CHOLHDL 3 11/12/2014 1057   VLDL 31.8 11/12/2014 1057   LDLCALC 70 11/12/2014 1057      Wt Readings from Last 3 Encounters:  12/25/14 327 lb 12.8 oz (148.689 kg)  11/12/14 326 lb 3.2 oz (147.963 kg)  05/07/14 315 lb 1.3 oz (142.919 kg)      Other studies Reviewed: Additional studies/ records that  were reviewed today include: . Review of the above records demonstrates:    ASSESSMENT AND PLAN:  1. Palpitations:  Jake Ortiz presents with episodes of palpitations. These typically occur in the evening. They last for several minutes and can last as long as 10 minutes. He thinks that the palpitations are fairly regular but his wife thinks they are irregular. They're not associated with any additional complications such as chest pain or shortness of breath or dizziness. It's possible that these are episodes of supraventricular tachycardia or might be atrial fibrillation.  I would like to get an echo cardiogram for further assessment of his LV function. We'll also place an event monitor on him for further evaluation. I've instructed him to try doing a Valsalva maneuver during one of these episodes to see if this resolves the tachycardia. This would favor the diagnosis of PSVT.  If we are to fine atrial fib relation he will need to start  anticoagulation. He has numerous risk factors which would elevate his CHADS score.    we will not start on any new medications at this  time.  2. Morbid obesity 3. Leg edema :  Given him information on the Lounge Dr. leg rest. 4. Hyperlipidemia 5. Obstructive sleep apnea 6. Diabetes mellitus   Current medicines are reviewed at length with the patient today.  The patient does not have concerns regarding medicines.  The following changes have been made:  no change   Disposition:   FU with me in 2-3 months     Signed, Daxtin Leiker, Wonda Cheng, MD  12/25/2014 10:25 AM    [facility] [place], [place], [place]  [address] Phone: [phone]; Fax: [phone]

## 2015-01-01 ENCOUNTER — Encounter: Payer: Self-pay | Admitting: Radiology

## 2015-01-01 ENCOUNTER — Ambulatory Visit (HOSPITAL_COMMUNITY): Payer: Medicare Other | Attending: Cardiovascular Disease

## 2015-01-01 ENCOUNTER — Encounter (INDEPENDENT_AMBULATORY_CARE_PROVIDER_SITE_OTHER): Payer: Medicare Other

## 2015-01-01 DIAGNOSIS — R002 Palpitations: Secondary | ICD-10-CM

## 2015-01-01 DIAGNOSIS — I4519 Other right bundle-branch block: Secondary | ICD-10-CM

## 2015-01-01 NOTE — Progress Notes (Signed)
Patient ID: Jake Ortiz, male   DOB: [birth year], 59 y.o.   MRN: [id number] [employer] 30 day monitor applied. EOS 01-30-15

## 2015-01-01 NOTE — Progress Notes (Signed)
2D Echo completed. 01/01/2015

## 2015-01-09 ENCOUNTER — Encounter: Payer: Self-pay | Admitting: Cardiovascular Disease

## 2015-01-11 ENCOUNTER — Telehealth: Payer: Self-pay | Admitting: *Deleted

## 2015-01-11 NOTE — Telephone Encounter (Signed)
In reply to My chart message.  Our [employer] representative was contacted regarding monitor not holding charge and [employer]' response to patient complaint.  He will have a new monitor overnight mailed to the patient.  Please return the faulty monitor to [employer].

## 2015-01-12 ENCOUNTER — Telehealth: Payer: Self-pay

## 2015-01-12 MED ORDER — METOPROLOL SUCCINATE ER 25 MG PO TB24: 25.0000 mg | ORAL_TABLET | Freq: Every day | ORAL | Status: DC

## 2015-01-12 NOTE — Telephone Encounter (Signed)
Patient called about his monitor report. Patient informed office that he was just sitting watching television when he had this SVT. Patient denied chest pain and had no other symptoms. Patient stated that he just felt a flutter. Consulted Dr. Radford Pax (DOD), Toprol XL 25 mg daily by mouth was ordered and sent to pharmacy in [place] and she wants patient to follow up with Dr. Acie Fredrickson in one week. Patient is schedule for 01/22/15 at 8:00 am with Dr. Acie Fredrickson. Patient verbalized understanding.

## 2015-01-14 ENCOUNTER — Encounter: Payer: Self-pay | Admitting: Nurse Practitioner

## 2015-01-14 ENCOUNTER — Encounter: Payer: Self-pay | Admitting: Cardiovascular Disease

## 2015-01-18 DIAGNOSIS — M545 Low back pain: Secondary | ICD-10-CM | POA: Diagnosis not present

## 2015-01-18 DIAGNOSIS — G894 Chronic pain syndrome: Secondary | ICD-10-CM | POA: Diagnosis not present

## 2015-01-18 DIAGNOSIS — M5117 Intervertebral disc disorders with radiculopathy, lumbosacral region: Secondary | ICD-10-CM | POA: Diagnosis not present

## 2015-01-18 DIAGNOSIS — M5137 Other intervertebral disc degeneration, lumbosacral region: Secondary | ICD-10-CM | POA: Diagnosis not present

## 2015-01-20 ENCOUNTER — Encounter: Payer: Self-pay | Admitting: Endocrinology

## 2015-01-20 ENCOUNTER — Ambulatory Visit (INDEPENDENT_AMBULATORY_CARE_PROVIDER_SITE_OTHER): Payer: Medicare Other | Admitting: Endocrinology

## 2015-01-20 VITALS — BP 140/75 | HR 76 | Resp 16 | Ht 72.0 in | Wt 328.8 lb

## 2015-01-20 DIAGNOSIS — B372 Candidiasis of skin and nail: Secondary | ICD-10-CM

## 2015-01-20 DIAGNOSIS — IMO0002 Reserved for concepts with insufficient information to code with codable children: Secondary | ICD-10-CM

## 2015-01-20 DIAGNOSIS — E1140 Type 2 diabetes mellitus with diabetic neuropathy, unspecified: Secondary | ICD-10-CM | POA: Diagnosis not present

## 2015-01-20 DIAGNOSIS — E1165 Type 2 diabetes mellitus with hyperglycemia: Secondary | ICD-10-CM | POA: Diagnosis not present

## 2015-01-20 LAB — MICROALBUMIN / CREATININE URINE RATIO
Creatinine,U: 88 mg/dL
MICROALB/CREAT RATIO: 1.9 mg/g (ref 0.0–30.0)
Microalb, Ur: 1.7 mg/dL (ref 0.0–1.9)

## 2015-01-20 LAB — HEMOGLOBIN A1C: Hgb A1c MFr Bld: 6.3 % (ref 4.6–6.5)

## 2015-01-20 MED ORDER — CANAGLIFLOZIN 100 MG PO TABS: 100.0000 mg | ORAL_TABLET | Freq: Every day | ORAL | Status: DC

## 2015-01-20 NOTE — Progress Notes (Signed)
Patient ID: Jake Ortiz, male   DOB: 02/10/1956, 59 y.o.   MRN: [id number]           Reason for Appointment: Consultation for Type 2 Diabetes   Referring physician: BLYTH  History of Present Illness:          Diagnosis: Type 2 diabetes mellitus, date of diagnosis: 1996       Past history:  He was initially treated with metformin at diagnosis in about 5 years later he was started on insulin He had been on Levemir and possibly other insulin regimens before going on an insulin pump in 2005 Apparently was put on insulin pump because of his large insulin requirement and poor control Also has been continued on metformin  Recent history:  He is on a Medtronic 723 insulin pump but is using a regimen of basal only with the pump and bolusing with Humalog using a syringe. He thinks he is using a 1:1 ratio for carbohydrate coverage although is using somewhat arbitrary coverage of most meals.  Does not use any reference materials for counting carbohydrates  BASAL RATES: Midnight = 7.0, 4 AM = 7.5, 9 AM = 5.0 and 10 PM = 6.0  Although he is checking his blood sugar with the Contour meter he is not linking the meter to the pump. Usually checking blood sugar several times a day He thinks his blood sugars tend to be higher when he gains weight which he has over the last few months More recently has been trying to start walking more regularly and watching his diet a little better with some tendency to low blood sugars, usually in the early afternoon Current blood sugar patterns: His fasting blood sugars are fairly consistently near normal Blood sugars are relatively lower mid day when he checks them around 1 PM or so Blood sugars around supper time are fairly good on average although occasionally relatively higher Blood sugars after supper are somewhat variable but on average fairly well controlled; checking blood sugars usually between 8-10 PM He has had fairly good A1c results in the past  year although previously ranging from 7-7.9  Hypoglycemia: He has  4 low blood sugars between 12: 40-1:40 PM recently and one low blood sugar of 49 right at mealtime when he overbolused        Oral hypoglycemic drugs the patient is taking are: Metformin 1 g twice a day      INSULIN regimen is described as: Humalog  Compliance with the medical regimen: Fair, improving recently  Glucose monitoring:  done 4-6 times a day         Glucometer: Contour    Blood Glucose readings by time of day and averages from meter download:  PREMEAL Breakfast Lunch Dinner  PCS   Overall   Glucose range:  81-158   58-173  55-166   49-228    Median:  112   109   116   131   118    Self-care: The diet that the patient has been following is: tries to limit high-fat meals   Meals: 3 meals per day. Breakfast is eggs and toast          Exercise:          Dietician visit, most recent:.               Weight history:  Wt Readings from Last 3 Encounters:  01/20/15 328 lb 12.8 oz (149.143 kg)  12/25/14 327 lb 12.8  oz (148.689 kg)  11/12/14 326 lb 3.2 oz (147.963 kg)    Glycemic control: 07/2014: 6.1, previously 6.6    No results found for: HGBA1C Lab Results  Component Value Date   LDLCALC 70 11/12/2014   CREATININE 0.8 11/12/2014         Medication List       This list is accurate as of: 01/20/15  9:17 AM.  Always use your most recent med list.               aspirin 325 MG tablet  Take 325 mg by mouth daily.     atorvastatin 20 MG tablet  Commonly known as:  LIPITOR  Take 20 mg by mouth daily.     cyclobenzaprine 10 MG tablet  Commonly known as:  FLEXERIL  Take 10 mg by mouth as needed. For back pain     FISH OIL PO  Take 1 capsule by mouth 2 (two) times daily.     fluticasone 50 MCG/ACT nasal spray  Commonly known as:  FLONASE  USE 2 SPRAYS IN EACH NOSTRIL DAILY     HUMALOG 100 UNIT/ML injection  Generic drug:  insulin lispro  Insulin pump-sliding scale      HYDROcodone-acetaminophen 10-325 MG per tablet  Commonly known as:  NORCO  Take 1 tablet by mouth 2 (two) times daily as needed.     losartan 25 MG tablet  Commonly known as:  COZAAR  TAKE 1 TABLET DAILY     magnesium oxide 400 MG tablet  Commonly known as:  MAG-OX  Take 400 mg by mouth daily.     metFORMIN 1000 MG tablet  Commonly known as:  GLUCOPHAGE  Take 1,000 mg by mouth 2 (two) times daily.     metoprolol succinate 25 MG 24 hr tablet  Commonly known as:  TOPROL XL  Take 1 tablet (25 mg total) by mouth daily.     VITAMIN D-3 PO  Take 1 capsule by mouth daily.        Allergies: No Known Allergies  Past Medical History  Diagnosis Date  . Diabetes mellitus   . Undiagnosed cardiac murmurs 01/25/2011  . SLEEP APNEA 01/25/2011  . Morbid obesity 01/25/2011  . Mixed hyperlipidemia 01/25/2011  . DM 01/25/2011  . DIVERTICULOSIS OF COLON 01/25/2011  . COLONIC POLYPS, BENIGN 01/25/2011  . BACK STRAIN, LUMBAR 01/25/2011  . ALLERGIC RHINITIS, SEASONAL 01/25/2011  . Hypocalcemia 03/04/2012  . Preventative health care 03/04/2012  . Chronic radicular low back pain 01/25/2011    Qualifier: Diagnosis of  By: Charlett Blake MD, Stacey  1. Focal annular tear of the L4-5 disc at the level of the right  neural foramen with a tiny disc protrusion compressing the right L4  nerve in the neural foramen. This is more prominent than on the  prior study.  2. 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  Follows [facility]evie [facility]  . LAE (left atrial enlargement) 06/27/2013  . RBBB 01/25/2011    Qualifier: Diagnosis of  By: Charlett Blake MD, Erline Levine    . Personal history of colonic polyps- sessile serrated adenoma 12/21/2006  . Cough 11/10/2013  . Pain in right axilla 11/10/2013  . Tinea pedis 11/15/2014    Past Surgical History  Procedure Laterality Date  . Tonsillectomy    . Cholecystectomy    . Skin biopsy on abdomen      unknown results    Family  History    Problem Relation Age of Onset  . Cancer Mother     metastatic breast ca  . Allergies Mother   . Diabetes Father   . Other Father     renal failure/ CHF  . Coronary artery disease Father     s/p MI  . Diabetes Sister     Type 1  . Coronary artery disease Sister     s/p stents  . Heart disease Sister   . Obesity Brother   . Other Brother     Sport and exercise psychologist  . Asthma Daughter   . Other Daughter     back disease  . Coronary artery disease Maternal Grandmother   . Diabetes Brother   . Heart disease Brother     s/p CABG  . Diabetes Brother     diet and oral meds  . Hypertension Brother   . Kidney disease Daughter     in childhood  . Colon cancer Neg Hx   . Pancreatic cancer Neg Hx   . Stomach cancer Neg Hx     Social History:  reports that he quit smoking about 24 years ago. He has never used smokeless tobacco. He reports that he drinks alcohol. He reports that he does not use illicit drugs.    Review of Systems       Vision is normal. Most recent eye exam was in 6/14       No complaints of headaches   Has history of seasonal allergic rhinitis, currently not symptomatic       Lipids: Under control with Lipitor 20 mg       Lab Results  Component Value Date   CHOL 142 11/12/2014   HDL 40.70 11/12/2014   LDLCALC 70 11/12/2014   TRIG 159.0* 11/12/2014   CHOLHDL 3 11/12/2014                  Skin: No rash or infections     Thyroid:  No  unusual fatigue.     The blood pressure has been      He has periodic swelling of feet, some improvement when he loses weight and will use elastic stockings.  Currently not using diuretics     No shortness of breath   No chest tightness  on exertion.    Currently being evaluated by cardiologist for palpitations and is being treated with metoprolol     Bowel habits: Normal.      No joint  pains.  Has had low back problems with sciatica and this has caused some numbness in the right lower leg         He has a history of  Numbness in his feet.  No tingling, burning or pins and needles       Skin: He has some whitish discoloration between the fourth and fifth toes on the right side, using Lamisil once a day    LABS:  No visits with results within 1 Week(s) from this visit. Latest known visit with results is:  Office Visit on 11/12/2014  Component Date Value Ref Range Status  . WBC 11/12/2014 6.1  4.0 - 10.5 K/uL Final  . RBC 11/12/2014 6.09* 4.22 - 5.81 Mil/uL Final  . Platelets 11/12/2014 145.0* 150.0 - 400.0 K/uL Final  . Hemoglobin 11/12/2014 12.9* 13.0 - 17.0 g/dL Final  . HCT 11/12/2014 41.9  39.0 - 52.0 % Final  . MCV 11/12/2014 68.8 Repeated and verified X2.* 78.0 - 100.0 fl Final  .  MCHC 11/12/2014 30.9  30.0 - 36.0 g/dL Final  . RDW 11/12/2014 18.1* 11.5 - 15.5 % Final  . TSH 11/12/2014 1.49  0.35 - 4.50 uIU/mL Final  . Sodium 11/12/2014 139  135 - 145 mEq/L Final  . Potassium 11/12/2014 4.0  3.5 - 5.1 mEq/L Final  . Chloride 11/12/2014 105  96 - 112 mEq/L Final  . CO2 11/12/2014 26  19 - 32 mEq/L Final  . Calcium 11/12/2014 9.1  8.4 - 10.5 mg/dL Final  . Albumin 11/12/2014 4.2  3.5 - 5.2 g/dL Final  . BUN 11/12/2014 10  6 - 23 mg/dL Final  . Creatinine, Ser 11/12/2014 0.8  0.4 - 1.5 mg/dL Final  . Glucose, Bld 11/12/2014 93  70 - 99 mg/dL Final  . Phosphorus 11/12/2014 3.4  2.3 - 4.6 mg/dL Final  . GFR 11/12/2014 107.79  >60.00 mL/min Final  . Cholesterol 11/12/2014 142  0 - 200 mg/dL Final   ATP III Classification       Desirable:  < 200 mg/dL               Borderline High:  200 - 239 mg/dL          High:  > = 240 mg/dL  . Triglycerides 11/12/2014 159.0* 0.0 - 149.0 mg/dL Final   Normal:  <150 mg/dLBorderline High:  150 - 199 mg/dL  . HDL 11/12/2014 40.70  >39.00 mg/dL Final  . VLDL 11/12/2014 31.8  0.0 - 40.0 mg/dL Final  . LDL Cholesterol 11/12/2014 70  0 - 99 mg/dL Final  . Total CHOL/HDL Ratio 11/12/2014 3   Final                  Men          Women1/2 Average Risk     3.4           3.3Average Risk          5.0          4.42X Average Risk          9.6          7.13X Average Risk          15.0          11.0                      . NonHDL 11/12/2014 101.30   Final   NOTE:  Non-HDL goal should be 30 mg/dL higher than patient's LDL goal (i.e. LDL goal of < 70 mg/dL, would have non-HDL goal of < 100 mg/dL)  . Total Bilirubin 11/12/2014 1.0  0.2 - 1.2 mg/dL Final  . Bilirubin, Direct 11/12/2014 0.2  0.0 - 0.3 mg/dL Final  . Alkaline Phosphatase 11/12/2014 53  39 - 117 U/L Final  . AST 11/12/2014 67* 0 - 37 U/L Final  . ALT 11/12/2014 56* 0 - 53 U/L Final  . Total Protein 11/12/2014 7.4  6.0 - 8.3 g/dL Final  . Albumin 11/12/2014 4.2  3.5 - 5.2 g/dL Final    Physical Examination:  BP 162/85 mmHg  Pulse 76  Resp 16  Ht 6' (1.829 m)  Wt 328 lb 12.8 oz (149.143 kg)  BMI 44.58 kg/m2  SpO2 95%  Repeat blood pressure 140/75  GENERAL:         Patient has generalized obesity.   HEENT:         Eye exam shows normal external appearance. Fundus exam shows  no retinopathy. Oral exam shows normal mucosa .  NECK:         General:  he has significant acanthosis of the back of his neck There is no lymphadenopathy.  Carotids are normal to palpation and no bruit heard.  Thyroid is not enlarged and no nodules felt.   LUNGS:         Chest is symmetrical. Lungs are clear to auscultation.Marland Kitchen   HEART:         Heart sounds:  S1 and S2 are normal. No murmurs or clicks heard., no S3 or S4.   ABDOMEN:   There is no distention present. Liver and spleen are not palpable. No other mass or tenderness present.  EXTREMITIES:     There is no edema. No skin lesions present.Marland Kitchen  NEUROLOGICAL:  ankle reflexes are absent. Biceps reflexes appear normal  Vibration sense is markedly reduced on the left and moderately on the right toes  Diabetic foot exam shows decreased monofilament sensation in the toes and plantar surfaces, no skin lesions or ulcers on the feet and normal pedal pulses MUSCULOSKELETAL:        There is no enlargement or deformity of the joints. Spine is normal to inspection.Marland Kitchen   SKIN:       No rash; whitish area on the proximal part between the fourth and fifth toes on the right side and on the medial side of the fifth toe has superficial ulceration without exudate    ASSESSMENT:  Diabetes type 2, uncontrolled with severe obesity    Although he appears to have good blood sugar control at home he is having some variability in his blood sugars especially some tendency to hypoglycemia after walking in the morning and also occasional high postprandial readings in the evenings Previously his A1c has been near normal His main difficulty is losing weight and is significantly insulin resistant requiring about 300 units of insulin a day. He is fairly compliant with blood sugar monitoring, mealtime insulin even when eating out and using his pump as directed Not clear if he is benefiting much from metformin  He probably can benefit significantly from using SGLT-2 drugs for helping his blood sugar control and weight loss and concomitantly. Discussed action of SGLT 2 drugs on lowering glucose by decreasing kidney absorption of glucose, benefits of weight loss and lower blood pressure, possible side effects including candidiasis and dosage regimen    Complications: Appears to have peripheral neuropathy with some sensory loss.  No history of nephropathy or retinopathy.  Has history of HYPERLIPIDEMIA controlled with 20 mg Lipitor  No history of significant hypertension, currently only on 25 mg losartan  Mild intertrigo between the fourth and fifth toes of the right side, currently using Lamisil only once a day  PLAN:   Trial of Invokana 100 mg before breakfast daily.  Reduce basal rate at 9 AM to 4.5  Once blood sugars are improving with Invokana he will reduce his basal rates by 1-1.5 units per hour round the clock  Continue efforts to lose weight with exercise and moderation of  calories  Consider consultation with diabetes educator or dietitian if needed  Continue metformin  Advised him to link his monitor with his pump for better analysis of his pump download  He will try to take his Humalog injection right after eating especially if he is not sure how much she will eat.  Consider using U-200 insulin with the Humalog pen although he prefers not to do this at  this time   Schedule follow-up eye exam which is due now  Use Lamisil twice a day on the area of superficial candidiasis on the right foot  Counseling time over 50% of today's 60 minute visit  Elder Davidian 01/20/2015, 9:17 AM   Note: This office note was prepared with Estate agent. Any transcriptional errors that result from this process are unintentional.

## 2015-01-20 NOTE — Patient Instructions (Signed)
Take Humalog right after meal if not sure of food intake  Reduce basal by 1-1.5 if sugars come down with Invokana  Invokana in am at 8-9 am

## 2015-01-22 ENCOUNTER — Encounter: Payer: Self-pay | Admitting: Cardiovascular Disease

## 2015-01-22 ENCOUNTER — Ambulatory Visit (INDEPENDENT_AMBULATORY_CARE_PROVIDER_SITE_OTHER): Payer: Medicare Other | Admitting: Cardiovascular Disease

## 2015-01-22 VITALS — BP 124/72 | HR 71 | Ht 71.0 in | Wt 329.4 lb

## 2015-01-22 DIAGNOSIS — I10 Essential (primary) hypertension: Secondary | ICD-10-CM

## 2015-01-22 DIAGNOSIS — I471 Supraventricular tachycardia: Secondary | ICD-10-CM

## 2015-01-22 DIAGNOSIS — E782 Mixed hyperlipidemia: Secondary | ICD-10-CM | POA: Diagnosis not present

## 2015-01-22 MED ORDER — METOPROLOL SUCCINATE ER 25 MG PO TB24: 25.0000 mg | ORAL_TABLET | Freq: Every day | ORAL | Status: DC

## 2015-01-22 NOTE — Progress Notes (Signed)
Cardiology Office Note   Date:  01/22/2015   ID:  Jake Ortiz, DOB Feb 07, 1956, MRN [id number]  PCP:  Penni Homans, MD  Cardiologist:   Thayer Headings, MD   Chief Complaint  Patient presents with  . Follow-up    palpitations   Problem list: 1. Diabetes mellitus 2. Morbid obesity 3. Hyperlipidemia 4. Obstructive sleep apnea   History of Present Illness: Jake Ortiz is a 59 y.o. male who presents for palpitations.  Has been having palpitations for year.   The palpitations feels like a flutter in his chest .  HR of 150 on occasion ( measured by his BP cuff)  Not related to eating or drinking . Or exercise No associated symptoms of CP or pre-syncope, no chest pressure.  Able to do his typical activity through the palpitations. Last several minutes - 10 minutes. Wife is a Marine scientist, she thinks it is irregular - like atrial fib.   Does not get any regular exercise,  Slowly getting back into working out. Does not have any CP or dyspnea when he goes for a walk.  Retired, was an Clinical biochemist.   01/22/2015:  I saw [name] several weeks ago. He had a monitor placed. He was found have superventricular tachycardia. We started him on Toprol-XL 25 mg a day. Had one brief episode of tachycardia at the day that he started the prescription but has not noticed any type of palpitations.      Past Medical History  Diagnosis Date  . Diabetes mellitus   . Undiagnosed cardiac murmurs 01/25/2011  . SLEEP APNEA 01/25/2011  . Morbid obesity 01/25/2011  . Mixed hyperlipidemia 01/25/2011  . DM 01/25/2011  . DIVERTICULOSIS OF COLON 01/25/2011  . COLONIC POLYPS, BENIGN 01/25/2011  . BACK STRAIN, LUMBAR 01/25/2011  . ALLERGIC RHINITIS, SEASONAL 01/25/2011  . Hypocalcemia 03/04/2012  . Preventative health care 03/04/2012  . Chronic radicular low back pain 01/25/2011    Qualifier: Diagnosis of  By: Charlett Blake MD, Stacey  1. Focal annular tear of the L4-5 disc at the level of the right  neural foramen  with a tiny disc protrusion compressing the right L4  nerve in the neural foramen. This is more prominent than on the  prior study.  2. 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  Follows with [facility]ie [facility]  . LAE (left atrial enlargement) 06/27/2013  . RBBB 01/25/2011    Qualifier: Diagnosis of  By: Charlett Blake MD, Erline Levine    . Personal history of colonic polyps- sessile serrated adenoma 12/21/2006  . Cough 11/10/2013  . Pain in right axilla 11/10/2013  . Tinea pedis 11/15/2014  . 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 Degeneration of lumbar or lumbosacral intervertebral disc   . Chronic pain syndrome     Past Surgical History  Procedure Laterality Date  . Tonsillectomy    . Cholecystectomy    . Skin biopsy on abdomen      unknown results     Current Outpatient Prescriptions  Medication Sig Dispense Refill  . aspirin 325 MG tablet Take 325 mg by mouth daily.      Marland Kitchen atorvastatin (LIPITOR) 10 MG tablet Take 10 mg by mouth daily.    . canagliflozin (INVOKANA) 100 MG TABS tablet Take 100 mg by mouth daily.    . Cholecalciferol (VITAMIN D-3 PO) Take 1,000 Int'l Units by mouth daily.     Marland Kitchen  cyclobenzaprine (FLEXERIL) 10 MG tablet Take 10 mg by mouth as needed. For back pain     . HUMALOG 100 UNIT/ML injection Insulin pump-sliding scale    . HYDROcodone-acetaminophen (NORCO) 10-325 MG per tablet Take 1 tablet by mouth every 6 (six) hours as needed.     Marland Kitchen losartan (COZAAR) 25 MG tablet TAKE 1 TABLET DAILY (Patient taking differently: TAKE 1 TABLET BY MOUTH DAILY) 90 tablet 1  . metFORMIN (GLUCOPHAGE) 1000 MG tablet Take 1,000 mg by mouth 2 (two) times daily.      . metoprolol succinate (TOPROL XL) 25 MG 24 hr tablet Take 1 tablet (25 mg total) by mouth daily. 30 tablet 1  . Omega-3 Fatty Acids (FISH OIL PO) Take 500 mg by mouth 3 (three) times daily.      No current facility-administered medications  for this visit.    Allergies:   Review of patient's allergies indicates no known allergies.    Social History:  The patient  reports that he quit smoking about 24 years ago. He has never used smokeless tobacco. He reports that he drinks alcohol. He reports that he does not use illicit drugs.   Family History:  The patient's family history includes Allergies in his mother; Asthma in his daughter; Cancer in his mother; Coronary artery disease in his father, maternal grandmother, and sister; Diabetes in his brother, brother, daughter, father, and sister; Heart disease in his brother and sister; Hypertension in his brother; Kidney disease in his daughter; Obesity in his brother; Other in his brother, daughter, and father. There is no history of Colon cancer, Pancreatic cancer, or Stomach cancer.    ROS:  Please see the history of present illness.    Review of Systems: Constitutional:  denies fever, chills, diaphoresis, appetite change and fatigue.  HEENT: denies photophobia, eye pain, redness, hearing loss, ear pain, congestion, sore throat, rhinorrhea, sneezing, neck pain, neck stiffness and tinnitus.  Respiratory: denies SOB, DOE, cough, chest tightness, and wheezing.  Cardiovascular: denies chest pain, palpitations and leg swelling.  Gastrointestinal: denies nausea, vomiting, abdominal pain, diarrhea, constipation, blood in stool.  Genitourinary: denies dysuria, urgency, frequency, hematuria, flank pain and difficulty urinating.  Musculoskeletal: denies  myalgias, back pain, joint swelling, arthralgias and gait problem.   Skin: denies pallor, rash and wound.  Neurological: denies dizziness, seizures, syncope, weakness, light-headedness, numbness and headaches.   Hematological: denies adenopathy, easy bruising, personal or family bleeding history.  Psychiatric/ Behavioral: denies suicidal ideation, mood changes, confusion, nervousness, sleep disturbance and agitation.       All other  systems are reviewed and negative.    PHYSICAL EXAM: VS:  BP 124/72 mmHg  Pulse 71  Ht 5\' 11"  (1.803 m)  Wt 329 lb 6.4 oz (149.415 kg)  BMI 45.96 kg/m2  SpO2 96% , BMI Body mass index is 45.96 kg/(m^2). GEN: Well nourished, well developed, in no acute distress, moderately obese  HEENT: normal Neck: no JVD, carotid bruits, or masses Cardiac: RRR; soft systolic  Murmur, no  rubs, or gallops, 1+ pitting edema. He's wearing compression hose.  Respiratory:  clear to auscultation bilaterally, normal work of breathing GI: soft, nontender, nondistended, + BS, moderately obese  MS: no deformity or atrophy Skin: warm and dry, no rash Neuro:  Strength and sensation are intact Psych: normal   EKG:   Review of the rhythm event monitor revealed normal sinus rhythm with episodes of supraventricular tachycardia.   Recent Labs: 11/12/2014: ALT 56*; BUN 10; Creatinine 0.8; Hemoglobin 12.9*; Platelets  145.0*; Potassium 4.0; Sodium 139; TSH 1.49    Lipid Panel    Component Value Date/Time   CHOL 142 11/12/2014 1057   TRIG 159.0* 11/12/2014 1057   HDL 40.70 11/12/2014 1057   CHOLHDL 3 11/12/2014 1057   VLDL 31.8 11/12/2014 1057   LDLCALC 70 11/12/2014 1057      Wt Readings from Last 3 Encounters:  01/22/15 329 lb 6.4 oz (149.415 kg)  01/20/15 328 lb 12.8 oz (149.143 kg)  12/25/14 327 lb 12.8 oz (148.689 kg)      Other studies Reviewed: Additional studies/ records that were reviewed today include: . Review of the above records demonstrates:    ASSESSMENT AND PLAN:  1. Supraventricular tachycardia: The patient originally presented with palpitations. The event monitor revealed that he has episodes of SVT. We had not seen any evidence of atrial fibrillation. He was started on Toprol-XL 25 mg a day. Since that time he's not had any further arrhythmias. We'll continue with the current dose of Toprol-XL. I'll follow-up with him in 6 month.  2. Morbid obesity- I've encouraged him to  exercise on a regular basis and to lose weight. 3. Leg edema :  Given him information on the Lounge Dr. leg rest. 4. Hyperlipidemia - continue current dose of atorvastatin. 5. Obstructive sleep apnea - wears CPAP  6. Diabetes mellitus - followed by his general medical doctor. 7. Essential hypertension: Continue current medications. His blood pressure is well-controlled.  Current medicines are reviewed at length with the patient today.  The patient does not have concerns regarding medicines.  The following changes have been made:  no change   Disposition:   FU with me in 6 months     Signed, Nahser, Wonda Cheng, MD  01/22/2015 8:21 AM    [facility] [place], [place], [place]  [address] Phone: [phone]; Fax: [phone]

## 2015-01-22 NOTE — Patient Instructions (Signed)
Your physician recommends that you continue on your current medications as directed. Please refer to the Current Medication list given to you today.  Your physician wants you to follow-up in: 6 months with Dr. Nahser.  You will receive a reminder letter in the mail two months in advance. If you don't receive a letter, please call our office to schedule the follow-up appointment.  

## 2015-02-10 ENCOUNTER — Encounter: Payer: Self-pay | Admitting: Family Medicine

## 2015-02-11 ENCOUNTER — Other Ambulatory Visit: Payer: Self-pay | Admitting: Family Medicine

## 2015-02-11 MED ORDER — DOXYCYCLINE HYCLATE 100 MG PO TABS: 100.0000 mg | ORAL_TABLET | Freq: Two times a day (BID) | ORAL | Status: DC

## 2015-02-11 MED ORDER — ALBUTEROL SULFATE HFA 108 (90 BASE) MCG/ACT IN AERS: 2.0000 | INHALATION_SPRAY | Freq: Four times a day (QID) | RESPIRATORY_TRACT | Status: DC | PRN

## 2015-02-11 MED ORDER — BENZONATATE 100 MG PO CAPS: 100.0000 mg | ORAL_CAPSULE | Freq: Two times a day (BID) | ORAL | Status: DC | PRN

## 2015-02-15 ENCOUNTER — Ambulatory Visit (INDEPENDENT_AMBULATORY_CARE_PROVIDER_SITE_OTHER): Payer: Medicare Other | Admitting: Endocrinology

## 2015-02-15 ENCOUNTER — Other Ambulatory Visit: Payer: Self-pay | Admitting: *Deleted

## 2015-02-15 ENCOUNTER — Encounter: Payer: Self-pay | Admitting: Endocrinology

## 2015-02-15 VITALS — BP 128/64 | HR 82 | Temp 98.1°F | Resp 14 | Ht 71.0 in | Wt 325.0 lb

## 2015-02-15 DIAGNOSIS — B372 Candidiasis of skin and nail: Secondary | ICD-10-CM | POA: Diagnosis not present

## 2015-02-15 DIAGNOSIS — IMO0002 Reserved for concepts with insufficient information to code with codable children: Secondary | ICD-10-CM

## 2015-02-15 DIAGNOSIS — E1165 Type 2 diabetes mellitus with hyperglycemia: Secondary | ICD-10-CM

## 2015-02-15 DIAGNOSIS — E11321 Type 2 diabetes mellitus with mild nonproliferative diabetic retinopathy with macular edema: Secondary | ICD-10-CM | POA: Diagnosis not present

## 2015-02-15 LAB — HM DIABETES EYE EXAM

## 2015-02-15 MED ORDER — CANAGLIFLOZIN 300 MG PO TABS
300.0000 mg | ORAL_TABLET | Freq: Every day | ORAL | Status: DC
Start: 2015-02-15 — End: 2016-01-16

## 2015-02-15 NOTE — Patient Instructions (Signed)
Take down 4 am basal to 6.5

## 2015-02-15 NOTE — Progress Notes (Signed)
Patient ID: Jake Ortiz, male   DOB: 10-05-1956, 59 y.o.   MRN: [id number]           Reason for Appointment: Follow-up for Type 2 Diabetes   Referring physician: BLYTH  History of Present Illness:          Diagnosis: Type 2 diabetes mellitus, date of diagnosis: 1996       Past history:  He was initially treated with metformin at diagnosis in about 5 years later he was started on insulin He had been on Levemir and possibly other insulin regimens before going on an insulin pump in 2005 Apparently was put on insulin pump because of his large insulin requirement and poor control Also has been continued on metformin  Recent history:  He is on a Medtronic 723 insulin pump but is using a regimen of basal only with the pump and bolusing with Humalog using a syringe.  BASAL RATES: Midnight = 7.0, 4 AM = 7.5, 9 AM = 4.5 and 10 PM = 6.0, total basal rate about 140 units per day BOLUSES: Carbohydrate coverage 1:1 with Humalog  He was started on INVOKANA 100 mg in early March to help with his control and enable weight loss His blood sugars are significantly better and fairly close to normal now; also his basal rate was reduced by 0.5 units during the day He has had mild hypoglycemia documented but has not reduced his basal rates as yet So far has had no side effects with Invokana and has lost about 4 pounds Also has been trying to start walking more regularly and watching his diet a little better  Current blood sugar patterns and management:  His fasting blood sugars are consistently near normal with the lowest reading this morning  Blood sugars are overall relatively lower before lunch and supper  Blood sugars only minimally increased later in the evening even after his meals  He has still continued to take mealtime coverage with the Humalog syringe and is again using about 1 unit for each gram of carbohydrate without hypoglycemia.  Only rarely has had a mild increase in blood  sugar  Less frequent hypoglycemia compared to the last visit  He has had fairly good A1c results in the past year although previously ranging from 7-7.9; most recently 6.3  Hypoglycemia: Minimal with only a few readings in the 60s      Oral hypoglycemic drugs the patient is taking are: Metformin 1 g twice a day, Invokana 100 mg daily      Compliance with the medical regimen:  improving recently  Glucose monitoring:  done 4-6 times a day         Glucometer: Contour    Blood Glucose readings by time of day and averages from pump download:  PRE-MEAL Breakfast Lunch Dinner Bedtime Overall  Glucose range:  71-185   62-160   65-134   66-149    Mean/median:  115     115   105+/-28     Self-care: The diet that the patient has been following is: tries to limit high-fat meals   Meals: 3 meals per day. Breakfast is eggs and toast          Exercise:  recently trying to walk         Dietician visit, most recent: None recently .               Weight history:  Wt Readings from Last 3 Encounters:  02/15/15 325  lb (147.419 kg)  01/22/15 329 lb 6.4 oz (149.415 kg)  01/20/15 328 lb 12.8 oz (149.143 kg)    Glycemic control: 07/2014: 6.1, previously 6.6     Lab Results  Component Value Date   HGBA1C 6.3 01/20/2015   Lab Results  Component Value Date   MICROALBUR 1.7 01/20/2015   LDLCALC 70 11/12/2014   CREATININE 0.8 11/12/2014         Medication List       This list is accurate as of: 02/15/15  4:49 PM.  Always use your most recent med list.               albuterol 108 (90 BASE) MCG/ACT inhaler  Commonly known as:  PROVENTIL HFA;VENTOLIN HFA  Inhale 2 puffs into the lungs every 6 (six) hours as needed for wheezing or shortness of breath.     aspirin 325 MG tablet  Take 325 mg by mouth daily.     atorvastatin 10 MG tablet  Commonly known as:  LIPITOR  Take 10 mg by mouth daily.     benzonatate 100 MG capsule  Commonly known as:  TESSALON  Take 1 capsule (100 mg total)  by mouth 2 (two) times daily as needed for cough.     canagliflozin 100 MG Tabs tablet  Commonly known as:  INVOKANA  Take 100 mg by mouth daily.     cyclobenzaprine 10 MG tablet  Commonly known as:  FLEXERIL  Take 10 mg by mouth as needed. For back pain     doxycycline 100 MG tablet  Commonly known as:  VIBRA-TABS  Take 1 tablet (100 mg total) by mouth 2 (two) times daily.     FISH OIL PO  Take 500 mg by mouth 3 (three) times daily.     HUMALOG 100 UNIT/ML injection  Generic drug:  insulin lispro  Insulin pump-sliding scale     HYDROcodone-acetaminophen 10-325 MG per tablet  Commonly known as:  NORCO  Take 1 tablet by mouth every 6 (six) hours as needed.     losartan 25 MG tablet  Commonly known as:  COZAAR  TAKE 1 TABLET DAILY     metFORMIN 1000 MG tablet  Commonly known as:  GLUCOPHAGE  Take 1,000 mg by mouth 2 (two) times daily.     metoprolol succinate 25 MG 24 hr tablet  Commonly known as:  TOPROL XL  Take 1 tablet (25 mg total) by mouth daily.     VITAMIN D-3 PO  Take 1,000 Int'l Units by mouth daily.        Allergies: No Known Allergies  Past Medical History  Diagnosis Date  . Diabetes mellitus   . Undiagnosed cardiac murmurs 01/25/2011  . SLEEP APNEA 01/25/2011  . Morbid obesity 01/25/2011  . Mixed hyperlipidemia 01/25/2011  . DM 01/25/2011  . DIVERTICULOSIS OF COLON 01/25/2011  . COLONIC POLYPS, BENIGN 01/25/2011  . BACK STRAIN, LUMBAR 01/25/2011  . ALLERGIC RHINITIS, SEASONAL 01/25/2011  . Hypocalcemia 03/04/2012  . Preventative health care 03/04/2012  . Chronic radicular low back pain 01/25/2011    Qualifier: Diagnosis of  By: Charlett Blake MD, Stacey  1. Focal annular tear of the L4-5 disc at the level of the right  neural foramen with a tiny disc protrusion compressing the right L4  nerve in the neural foramen. This is more prominent than on the  prior study.  2. 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  Follows  with Dr Genevie Ann  [facility]  . LAE (left atrial enlargement) 06/27/2013  . RBBB 01/25/2011    Qualifier: Diagnosis of  By: Charlett Blake MD, Erline Levine    . Personal history of colonic polyps- sessile serrated adenoma 12/21/2006  . Cough 11/10/2013  . Pain in right axilla 11/10/2013  . Tinea pedis 11/15/2014  . 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 Degeneration of lumbar or lumbosacral intervertebral disc   . Chronic pain syndrome     Past Surgical History  Procedure Laterality Date  . Tonsillectomy    . Cholecystectomy    . Skin biopsy on abdomen      unknown results    Family History  Problem Relation Age of Onset  . Cancer Mother     metastatic breast ca  . Allergies Mother   . Diabetes Father   . Other Father     renal failure/ CHF  . Coronary artery disease Father     s/p MI  . Diabetes Sister     Type 1  . Coronary artery disease Sister     s/p stents  . Heart disease Sister   . Obesity Brother   . Other Brother     Sport and exercise psychologist  . Asthma Daughter   . Other Daughter     back disease  . Diabetes Daughter   . Coronary artery disease Maternal Grandmother   . Diabetes Brother   . Heart disease Brother     s/p CABG  . Diabetes Brother     diet and oral meds  . Hypertension Brother   . Kidney disease Daughter     in childhood  . Colon cancer Neg Hx   . Pancreatic cancer Neg Hx   . Stomach cancer Neg Hx     Social History:  reports that he quit smoking about 24 years ago. He has never used smokeless tobacco. He reports that he drinks alcohol. He reports that he does not use illicit drugs.    Review of Systems    Most recent eye exam was in 6/14        Lipids: Under control with Lipitor 20 mg       Lab Results  Component Value Date   CHOL 142 11/12/2014   HDL 40.70 11/12/2014   LDLCALC 70 11/12/2014   TRIG 159.0* 11/12/2014   CHOLHDL 3 11/12/2014          The blood pressure has been normal, only taking low dose losartan and  metoprolol      He has periodic swelling of feet, less recently       Skin: He has  whitish discoloration between the fourth and fifth toes on the right side and some between the third and fourth.  Not better with using Lamisil Cream   LABS:  No visits with results within 1 Week(s) from this visit. Latest known visit with results is:  Office Visit on 01/20/2015  Component Date Value Ref Range Status  . Hgb A1c MFr Bld 01/20/2015 6.3  4.6 - 6.5 % Final   Glycemic Control Guidelines for People with Diabetes:Non Diabetic:  <6%Goal of Therapy: <7%Additional Action Suggested:  >8%   . Microalb, Ur 01/20/2015 1.7  0.0 - 1.9 mg/dL Final  . Creatinine,U 01/20/2015 88.0   Final  . Microalb Creat Ratio 01/20/2015 1.9  0.0 - 30.0 mg/g Final    Physical Examination:  BP 128/64 mmHg  Pulse 82  Temp(Src) 98.1 F (  36.7 C)  Resp 14  Ht 5\' 11"  (1.803 m)  Wt 325 lb (147.419 kg)  BMI 45.35 kg/m2  SpO2 95%  SKIN:   whitish areas on the proximal part between the fourth and fifth toes on the right side and less between the third and fourth toes No pedal edema    ASSESSMENT:  Diabetes type 2, uncontrolled with severe obesity    He has responded to [place] with more consistent and fairly good blood sugars now Blood sugars recently appearing low normal at times and usually no postprandial hyperglycemia either He is still using mealtime coverage with injecting Humalog with a syringe while using large amounts of basal insulin with his pump Most recent A1c was 6.3 He has not lost much weight with starting Invokana but this may be because of overall lower blood sugars and taking large doses of insulin  Continued intertrigo between the fourth and fifth toes of the right side, currently using Lamisil without relief  PLAN:   Trial of Invokana 300 mg before breakfast instead of 100  Reduce basal rates by 0.5 round the clock and by 0.1 at 4 AM; will be using 6.5 units basal at 4 AM until 9  AM  Consider reducing mealtime coverage to 1:2 g, carbohydrate coverage  Continue efforts to lose weight with exercise and moderation of calories  Changes Lamisil to [place] and keep the area dry  Counseling time over 50% of today's 25 minute visit  Jake Ortiz 02/15/2015, 4:49 PM   Note: This office note was prepared with Dragon voice recognition system technology. Any transcriptional errors that result from this process are unintentional.

## 2015-02-16 LAB — BASIC METABOLIC PANEL
BUN: 13 mg/dL (ref 6–23)
CALCIUM: 9.6 mg/dL (ref 8.4–10.5)
CO2: 26 mEq/L (ref 19–32)
Chloride: 106 mEq/L (ref 96–112)
Creatinine, Ser: 0.89 mg/dL (ref 0.40–1.50)
GFR: 92.49 mL/min (ref >60.00)
GLUCOSE: 50 mg/dL — AB (ref 70–99)
POTASSIUM: 4.1 meq/L (ref 3.5–5.1)
Sodium: 139 mEq/L (ref 135–145)

## 2015-02-16 MED ORDER — KETOCONAZOLE 2 % EX CREA: 1.0000 "application " | TOPICAL_CREAM | Freq: Every day | CUTANEOUS | Status: DC

## 2015-02-25 ENCOUNTER — Ambulatory Visit (INDEPENDENT_AMBULATORY_CARE_PROVIDER_SITE_OTHER): Payer: Medicare Other | Admitting: Pulmonary Disease

## 2015-02-25 ENCOUNTER — Encounter: Payer: Self-pay | Admitting: Pulmonary Disease

## 2015-02-25 VITALS — BP 153/81 | HR 73 | Temp 97.8°F | Ht 71.0 in | Wt 318.0 lb

## 2015-02-25 DIAGNOSIS — G4733 Obstructive sleep apnea (adult) (pediatric): Secondary | ICD-10-CM | POA: Diagnosis not present

## 2015-02-25 NOTE — Patient Instructions (Signed)
Your CPAP is set at 8 cm We will take care of [facility] documents

## 2015-02-25 NOTE — Progress Notes (Signed)
   Subjective:    Patient ID: Jake Ortiz, male    DOB: 01/05/1956, 59 y.o.   MRN: [id number]  HPI 59 year old obese diabetic presents for FU of obstructive sleep apnea  PSG in 2006 [facility] [place]) showed AHI 25/h , desatn to 85% corrected by CPAP 12 cm with med FF mask  He gained 50-70 lbs since this study  Was unable to use CPAP initially due to sinus issues, did not tolerate nasal mask or FF - discomfort. Now maintained on autoCPAP  He has tried zyrtec, nasonex & netti pot for sinuses   1 mnth Download 01/2014 - avg pr 8 cm, good compliance, AHi 3/h 02/25/2015  Chief Complaint  Patient presents with  . Follow-up    OSA: uses CPAP every night.  Auto setting.  Needs new supplies, changed insurance to Medicare.  Huey Romans needs more information from [place] in order to obtain supplies.    He feels well rested, no snoring, prefers nasal pillows  DME needs documents filled out Mask ok, pressure ok He had issues with SVT, now controlled on toprol   Review of Systems neg for any significant sore throat, dysphagia, itching, sneezing, nasal congestion or excess/ purulent secretions, fever, chills, sweats, unintended wt loss, pleuritic or exertional cp, hempoptysis, orthopnea pnd or change in chronic leg swelling. Also denies presyncope, palpitations, heartburn, abdominal pain, nausea, vomiting, diarrhea or change in bowel or urinary habits, dysuria,hematuria, rash, arthralgias, visual complaints, headache, numbness weakness or ataxia.     Objective:   Physical Exam  Gen. Pleasant, obese, in no distress ENT - no lesions, no post nasal drip Neck: No JVD, no thyromegaly, no carotid bruits Lungs: no use of accessory muscles, no dullness to percussion, decreased without rales or rhonchi  Cardiovascular: Rhythm regular, heart sounds  normal, no murmurs or gallops, no peripheral edema Musculoskeletal: No deformities, no cyanosis or clubbing , no tremors        Assessment & Plan:

## 2015-02-26 NOTE — Assessment & Plan Note (Signed)
OSA is well controlled , do not think arrythmias related to sleep issues  Weight loss encouraged, compliance with goal of at least 4-6 hrs every night is the expectation. Advised against medications with sedative side effects Cautioned against driving when sleepy - understanding that sleepiness will vary on a day to day basis

## 2015-03-01 ENCOUNTER — Encounter: Payer: Self-pay | Admitting: Pulmonary Disease

## 2015-03-07 ENCOUNTER — Other Ambulatory Visit: Payer: Self-pay | Admitting: Family Medicine

## 2015-03-22 ENCOUNTER — Telehealth: Payer: Self-pay | Admitting: Pulmonary Disease

## 2015-03-22 ENCOUNTER — Encounter: Payer: Self-pay | Admitting: Family Medicine

## 2015-03-22 ENCOUNTER — Other Ambulatory Visit: Payer: Self-pay | Admitting: Family Medicine

## 2015-03-22 DIAGNOSIS — H9190 Unspecified hearing loss, unspecified ear: Secondary | ICD-10-CM

## 2015-03-22 NOTE — Telephone Encounter (Signed)
Spoke with pt. States that Jake Ortiz is withholding his supplies because his sleep study from 2006 is not "signed." Would like to speak to a [facility] about this issue.  [facility] - please advise. Thanks.

## 2015-03-22 NOTE — Telephone Encounter (Signed)
Sleep study faxed to [facility] Jake Ortiz We did not sign it it was done in new [employer]

## 2015-03-24 ENCOUNTER — Other Ambulatory Visit: Payer: Self-pay | Admitting: Family Medicine

## 2015-03-24 NOTE — Telephone Encounter (Signed)
Patient has not been seen by you since 05/07/2014 BP averaging 140's/mid 70's. Please advise on if Cozaar should be refilled. Thank you  Hepatic function panel  Status: Finalresult Visible to patient:  MyChart Nextappt: 04/15/2015 at 09:15 AM in Internal Medicine ([facility]) Dx:  Palpitations; Hyperlipidemia; Diabete...          Notes Recorded by Kris Hartmann, CMA on 11/19/2014 at 1:50 PM [facility]. Notes Recorded by Mosie Lukes, MD on 11/15/2014 at 7:04 PM Mild elevation of liver functions and mild anemia, minimize simple carbs and have him start Hemocyte F 1 tab po daily disp 30 with 2 rf (please send in). Would like to repeat hepatic panel, cbc, and perform iron studies and an acute hepatitis panel in about a month       Ref Range 51mo ago (11/12/14) 87mo ago (11/12/14)    Total Bilirubin 0.2 - 1.2 mg/dL 1.0     Bilirubin, Direct 0.0 - 0.3 mg/dL 0.2     Alkaline Phosphatase 39 - 117 U/L 53     AST 0 - 37 U/L 67 (H)     ALT 0 - 53 U/L 56 (H)     Total Protein 6.0 - 8.3 g/dL 7.4     Albumin 3.5 - 5.2 g/dL 4.2 4.2   Resulting Agency  [place] [facility] [place] [facility]

## 2015-03-24 NOTE — Telephone Encounter (Signed)
He can have a 30 day supply but needs an appt to evaluate labs and bp this month

## 2015-03-29 ENCOUNTER — Telehealth: Payer: Self-pay | Admitting: Family Medicine

## 2015-03-29 DIAGNOSIS — Z0110 Encounter for examination of ears and hearing without abnormal findings: Secondary | ICD-10-CM | POA: Diagnosis not present

## 2015-03-29 DIAGNOSIS — H9190 Unspecified hearing loss, unspecified ear: Secondary | ICD-10-CM | POA: Diagnosis not present

## 2015-03-29 NOTE — Telephone Encounter (Signed)
Referral was made to [facility] by Marji on 03/22/15, no insurance referral was required/will have referral back.

## 2015-03-29 NOTE — Telephone Encounter (Signed)
Relation to pt: self Call back number: [phone]   Reason for call:  Pt is at [facility] in need of a referral phone # [phone] (fax) [phone]

## 2015-04-05 ENCOUNTER — Ambulatory Visit: Payer: BLUE CROSS/BLUE SHIELD | Admitting: Cardiovascular Disease

## 2015-04-08 ENCOUNTER — Other Ambulatory Visit: Payer: Self-pay | Admitting: Family Medicine

## 2015-04-08 MED ORDER — LOSARTAN POTASSIUM 25 MG PO TABS: 25.0000 mg | ORAL_TABLET | Freq: Every day | ORAL | Status: DC

## 2015-04-15 ENCOUNTER — Other Ambulatory Visit (INDEPENDENT_AMBULATORY_CARE_PROVIDER_SITE_OTHER): Payer: Medicare Other

## 2015-04-15 DIAGNOSIS — IMO0002 Reserved for concepts with insufficient information to code with codable children: Secondary | ICD-10-CM

## 2015-04-15 DIAGNOSIS — E1165 Type 2 diabetes mellitus with hyperglycemia: Secondary | ICD-10-CM

## 2015-04-15 LAB — COMPREHENSIVE METABOLIC PANEL
ALK PHOS: 46 U/L (ref 39–117)
ALT: 28 U/L (ref 0–53)
AST: 31 U/L (ref 0–37)
Albumin: 4.1 g/dL (ref 3.5–5.2)
BUN: 11 mg/dL (ref 6–23)
CO2: 27 mEq/L (ref 19–32)
Calcium: 8.9 mg/dL (ref 8.4–10.5)
Chloride: 107 mEq/L (ref 96–112)
Creatinine, Ser: 0.8 mg/dL (ref 0.40–1.50)
GFR: 104.54 mL/min (ref >60.00)
Glucose, Bld: 101 mg/dL — ABNORMAL HIGH (ref 70–99)
Potassium: 3.8 mEq/L (ref 3.5–5.1)
Sodium: 138 mEq/L (ref 135–145)
Total Bilirubin: 0.9 mg/dL (ref 0.2–1.2)
Total Protein: 7 g/dL (ref 6.0–8.3)

## 2015-04-15 LAB — HEMOGLOBIN A1C: HEMOGLOBIN A1C: 5.6 % (ref 4.6–6.5)

## 2015-04-20 ENCOUNTER — Other Ambulatory Visit: Payer: Self-pay | Admitting: *Deleted

## 2015-04-20 ENCOUNTER — Encounter: Payer: Self-pay | Admitting: Endocrinology

## 2015-04-20 ENCOUNTER — Ambulatory Visit (INDEPENDENT_AMBULATORY_CARE_PROVIDER_SITE_OTHER): Payer: Medicare Other | Admitting: Endocrinology

## 2015-04-20 VITALS — BP 145/82 | HR 77 | Temp 98.2°F | Resp 16 | Ht 71.0 in | Wt 317.6 lb

## 2015-04-20 DIAGNOSIS — E119 Type 2 diabetes mellitus without complications: Secondary | ICD-10-CM

## 2015-04-20 DIAGNOSIS — E1140 Type 2 diabetes mellitus with diabetic neuropathy, unspecified: Secondary | ICD-10-CM

## 2015-04-20 DIAGNOSIS — E785 Hyperlipidemia, unspecified: Secondary | ICD-10-CM

## 2015-04-20 MED ORDER — INSULIN LISPRO 100 UNIT/ML ~~LOC~~ SOLN: SUBCUTANEOUS | Status: DC

## 2015-04-20 MED ORDER — METFORMIN HCL 1000 MG PO TABS: ORAL_TABLET | ORAL | Status: DC

## 2015-04-20 NOTE — Progress Notes (Signed)
Patient ID: Jake Ortiz, male   DOB: 10-29-1956, 59 y.o.   MRN: [id number]           Reason for Appointment: Follow-up for Type 2 Diabetes   Referring physician: BLYTH  History of Present Illness:          Diagnosis: Type 2 diabetes mellitus, date of diagnosis: 1996       Past history:  He was initially treated with metformin at diagnosis in about 5 years later he was started on insulin He had been on Levemir and possibly other insulin regimens before going on an insulin pump in 2005 Apparently was put on insulin pump because of his large insulin requirement and poor control Also has been continued on metformin He  had fairly good A1c results in the past year although previously ranging from 7-7.9; in 07/2014 Was 6.1  Recent history:  He is on a Medtronic 723 insulin pump but is using a regimen of basal only with the pump and bolusing with Humalog using a syringe.  BASAL RATES: Midnight = 6.0, 4 AM = 6, 9 AM = 4 and 10 PM = 5.25, total basal rate about 120 units per day BOLUSES: Carbohydrate coverage 1:2 with Humalog with a range  He was started on INVOKANA 100 mg in early March to help with his control and enable weight loss He is now taking 300 mg daily His blood sugars are consistently better and he has been able to reduce his basal insulin progressively He was taking about 150 units of basal prior to the above changes He thinks he has lost about 20 pounds with the above changes and also watching his diet and exercising His A1c is not the best level in quite some time  Current blood sugar patterns and management:  His fasting blood sugars are frequently low normal especially in the last week, he does not think he has any overnight hypoglycemia  Blood sugars are also fairly good at lunchtime with only occasional hypoglycemia  Blood sugars are mostly higher by suppertime with only a couple of readings below 100   He is occasionally having hypoglycemia with increased  activity and is not adjusting his basal rates her boluses to prevent this  He thinks that he will overcompensate when he tries to treat low sugars with food.  He thinks that drinking apple juice bring his blood sugar up fairly quickly but does not always do this  He does check a few readings after evening meals and these are fairly good.  However not checking sugars postprandially after breakfast; also difficult to know when he is actually doing mealtime boluses as he is doing it with a range  He will have occasional site problems with his infusion set especially when inserting the needle in his back   Hypoglycemia: Minimal with only a few readings in the 60s      Oral hypoglycemic drugs the patient is taking are: Metformin 1 g twice a day, Invokana 100 mg daily      Compliance with the medical regimen:  Excellent recently  Glucose monitoring:  done 4-5 times a day         Glucometer: Contour    Blood Glucose readings by time of day and averages from pump download:  PRE-MEAL Breakfast Lunch Dinner Bedtime Overall  Glucose range:  81-160   68-165   68-175   103-173    Mean/median:  101    146  129   118+/-30  Self-care: The diet that the patient has been following is: tries to limit high-fat meals   Meals: 3 meals per day. Breakfast is eggs and toast          Exercise:  trying to walk periodically now, sometimes more actively         Dietician visit, most recent: None recently .               Weight history:  Wt Readings from Last 3 Encounters:  04/20/15 317 lb 9.6 oz (144.062 kg)  02/25/15 318 lb (144.244 kg)  02/15/15 325 lb (147.419 kg)    Glycemic control:     Lab Results  Component Value Date   HGBA1C 5.6 04/15/2015   HGBA1C 6.3 01/20/2015   Lab Results  Component Value Date   MICROALBUR 1.7 01/20/2015   LDLCALC 70 11/12/2014   CREATININE 0.80 04/15/2015         Medication List       This list is accurate as of: 04/20/15  9:10 AM.  Always use your most  recent med list.               albuterol 108 (90 BASE) MCG/ACT inhaler  Commonly known as:  PROVENTIL HFA;VENTOLIN HFA  Inhale 2 puffs into the lungs every 6 (six) hours as needed for wheezing or shortness of breath.     aspirin 325 MG tablet  Take 325 mg by mouth daily.     atorvastatin 10 MG tablet  Commonly known as:  LIPITOR  Take 10 mg by mouth daily.     canagliflozin 300 MG Tabs tablet  Commonly known as:  INVOKANA  Take 300 mg by mouth daily before breakfast.     cyclobenzaprine 10 MG tablet  Commonly known as:  FLEXERIL  Take 10 mg by mouth as needed. For back pain     FISH OIL PO  Take 500 mg by mouth 3 (three) times daily.     fluticasone 50 MCG/ACT nasal spray  Commonly known as:  FLONASE  USE 2 SPRAYS IN EACH NOSTRIL DAILY     HUMALOG 100 UNIT/ML injection  Generic drug:  insulin lispro  Insulin pump-sliding scale     HYDROcodone-acetaminophen 10-325 MG per tablet  Commonly known as:  NORCO  Take 1 tablet by mouth every 6 (six) hours as needed.     ketoconazole 2 % cream  Commonly known as:  NIZORAL  Apply 1 application topically daily.     losartan 25 MG tablet  Commonly known as:  COZAAR  Take 1 tablet (25 mg total) by mouth daily.     metFORMIN 1000 MG tablet  Commonly known as:  GLUCOPHAGE  Take 1,000 mg by mouth 2 (two) times daily.     metoprolol succinate 25 MG 24 hr tablet  Commonly known as:  TOPROL XL  Take 1 tablet (25 mg total) by mouth daily.     VITAMIN D-3 PO  Take 1,000 Int'l Units by mouth daily.        Allergies: No Known Allergies  Past Medical History  Diagnosis Date  . Diabetes mellitus   . Undiagnosed cardiac murmurs 01/25/2011  . SLEEP APNEA 01/25/2011  . Morbid obesity 01/25/2011  . Mixed hyperlipidemia 01/25/2011  . DM 01/25/2011  . DIVERTICULOSIS OF COLON 01/25/2011  . COLONIC POLYPS, BENIGN 01/25/2011  . BACK STRAIN, LUMBAR 01/25/2011  . ALLERGIC RHINITIS, SEASONAL 01/25/2011  . Hypocalcemia 03/04/2012  . Preventative  health care 03/04/2012  .  Chronic radicular low back pain 01/25/2011    Qualifier: Diagnosis of  By: Charlett Blake MD, Stacey  1. Focal annular tear of the L4-5 disc at the level of the right  neural foramen with a tiny disc protrusion compressing the right L4  nerve in the neural foramen. This is more prominent than on the  prior study.  2. 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  Follows with [facility] Genevie [facility]  . LAE (left atrial enlargement) 06/27/2013  . RBBB 01/25/2011    Qualifier: Diagnosis of  By: Charlett Blake MD, Erline Levine    . Personal history of colonic polyps- sessile serrated adenoma 12/21/2006  . Cough 11/10/2013  . Pain in right axilla 11/10/2013  . Tinea pedis 11/15/2014  . 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 Degeneration of lumbar or lumbosacral intervertebral disc   . Chronic pain syndrome     Past Surgical History  Procedure Laterality Date  . Tonsillectomy    . Cholecystectomy    . Skin biopsy on abdomen      unknown results    Family History  Problem Relation Age of Onset  . Cancer Mother     metastatic breast ca  . Allergies Mother   . Diabetes Father   . Other Father     renal failure/ CHF  . Coronary artery disease Father     s/p MI  . Diabetes Sister     Type 1  . Coronary artery disease Sister     s/p stents  . Heart disease Sister   . Obesity Brother   . Other Brother     Sport and exercise psychologist  . Asthma Daughter   . Other Daughter     back disease  . Diabetes Daughter   . Coronary artery disease Maternal Grandmother   . Diabetes Brother   . Heart disease Brother     s/p CABG  . Diabetes Brother     diet and oral meds  . Hypertension Brother   . Kidney disease Daughter     in childhood  . Colon cancer Neg Hx   . Pancreatic cancer Neg Hx   . Stomach cancer Neg Hx     Social History:  reports that he quit smoking about 24 years ago. He has never used smokeless  tobacco. He reports that he drinks alcohol. He reports that he does not use illicit drugs.    Review of Systems    Most recent eye exam was in 3/16, has background retinopathy        Lipids: Under control with Lipitor 20 mg       Lab Results  Component Value Date   CHOL 142 11/12/2014   HDL 40.70 11/12/2014   LDLCALC 70 11/12/2014   TRIG 159.0* 11/12/2014   CHOLHDL 3 11/12/2014          The blood pressure has been normal, only taking low dose losartan and metoprolol      He has periodic swelling of feet, none recently       Skin: He has had some intertrigo, was given Nizoral cream with relief   LABS:  Lab on 04/15/2015  Component Date Value Ref Range Status  . Hgb A1c MFr Bld 04/15/2015 5.6  4.6 - 6.5 % Final   Glycemic Control Guidelines for People with Diabetes:Non Diabetic:  <6%Goal of Therapy: <7%Additional Action Suggested:  >8%   . Sodium 04/15/2015  138  135 - 145 mEq/L Final  . Potassium 04/15/2015 3.8  3.5 - 5.1 mEq/L Final  . Chloride 04/15/2015 107  96 - 112 mEq/L Final  . CO2 04/15/2015 27  19 - 32 mEq/L Final  . Glucose, Bld 04/15/2015 101* 70 - 99 mg/dL Final  . BUN 04/15/2015 11  6 - 23 mg/dL Final  . Creatinine, Ser 04/15/2015 0.80  0.40 - 1.50 mg/dL Final  . Total Bilirubin 04/15/2015 0.9  0.2 - 1.2 mg/dL Final  . Alkaline Phosphatase 04/15/2015 46  39 - 117 U/L Final  . AST 04/15/2015 31  0 - 37 U/L Final  . ALT 04/15/2015 28  0 - 53 U/L Final  . Total Protein 04/15/2015 7.0  6.0 - 8.3 g/dL Final  . Albumin 04/15/2015 4.1  3.5 - 5.2 g/dL Final  . Calcium 04/15/2015 8.9  8.4 - 10.5 mg/dL Final  . GFR 04/15/2015 104.54  >60.00 mL/min Final    Physical Examination:  BP 145/82 mmHg  Pulse 77  Temp(Src) 98.2 F (36.8 C)  Resp 16  Ht 5\' 11"  (1.803 m)  Wt 317 lb 9.6 oz (144.062 kg)  BMI 44.32 kg/m2  SpO2 96%      ASSESSMENT:  Diabetes type 2, uncontrolled with  obesity    He has responded to Invokana with more consistent and good blood sugar  control A1c is now down to 5.6% This is despite his reducing his insulin significantly and is taking at least 30 units less basal and also less Humalog for meals Blood sugars recently appearing low normal at breakfast and lunch especially in the last week around breakfast time His high readings are mostly before supper and these do not appear to be meal related although he has not checked readings 2 hours after lunch which is his main meal Blood sugars are fairly good after supper He has now lost weight with continuing changes in lifestyle, reducing insulin and continuing Invokana He does tend to have periodic hypoglycemia especially with increased exercise and has not been able to prevent this with using his temporary basal function on the pump  PLAN:   Reduce basal rates to 5.8 overnight and increase to 4.2 starting at 2 PM  May be able to reduce his mealtime coverage further at least at suppertime  To check some readings 2 hours after meals at 2 PM or so  Temporary basal of 50% when trying to increase activity with long walks  Check A1c again in 3 months  Call if having difficulties with consistent control  Counseling time on subjects discussed above is over 50% of today's 25 minute visit   Jake Ortiz 04/20/2015, 9:10 AM   Note: This office note was prepared with Estate agent. Any transcriptional errors that result from this process are unintentional.

## 2015-04-20 NOTE — Patient Instructions (Signed)
Temp basal for exercise

## 2015-04-23 ENCOUNTER — Encounter: Payer: Self-pay | Admitting: Family Medicine

## 2015-05-11 ENCOUNTER — Encounter: Payer: Self-pay | Admitting: Family Medicine

## 2015-05-14 ENCOUNTER — Ambulatory Visit (INDEPENDENT_AMBULATORY_CARE_PROVIDER_SITE_OTHER): Payer: Medicare Other | Admitting: Family Medicine

## 2015-05-14 ENCOUNTER — Encounter: Payer: Self-pay | Admitting: Family Medicine

## 2015-05-14 VITALS — BP 132/78 | HR 73 | Temp 98.6°F | Ht 71.5 in | Wt 318.5 lb

## 2015-05-14 DIAGNOSIS — E669 Obesity, unspecified: Secondary | ICD-10-CM

## 2015-05-14 DIAGNOSIS — Z418 Encounter for other procedures for purposes other than remedying health state: Secondary | ICD-10-CM

## 2015-05-14 DIAGNOSIS — G4733 Obstructive sleep apnea (adult) (pediatric): Secondary | ICD-10-CM

## 2015-05-14 DIAGNOSIS — E119 Type 2 diabetes mellitus without complications: Secondary | ICD-10-CM

## 2015-05-14 DIAGNOSIS — E785 Hyperlipidemia, unspecified: Secondary | ICD-10-CM

## 2015-05-14 DIAGNOSIS — Z0000 Encounter for general adult medical examination without abnormal findings: Secondary | ICD-10-CM

## 2015-05-14 DIAGNOSIS — M5416 Radiculopathy, lumbar region: Secondary | ICD-10-CM

## 2015-05-14 DIAGNOSIS — I10 Essential (primary) hypertension: Secondary | ICD-10-CM

## 2015-05-14 DIAGNOSIS — E782 Mixed hyperlipidemia: Secondary | ICD-10-CM

## 2015-05-14 DIAGNOSIS — M5410 Radiculopathy, site unspecified: Secondary | ICD-10-CM

## 2015-05-14 DIAGNOSIS — D509 Iron deficiency anemia, unspecified: Secondary | ICD-10-CM | POA: Diagnosis not present

## 2015-05-14 DIAGNOSIS — Z299 Encounter for prophylactic measures, unspecified: Secondary | ICD-10-CM

## 2015-05-14 DIAGNOSIS — G8929 Other chronic pain: Secondary | ICD-10-CM

## 2015-05-14 DIAGNOSIS — Z23 Encounter for immunization: Secondary | ICD-10-CM

## 2015-05-14 DIAGNOSIS — E1169 Type 2 diabetes mellitus with other specified complication: Secondary | ICD-10-CM

## 2015-05-14 LAB — CBC
HCT: 43.4 % (ref 39.0–52.0)
Hemoglobin: 13.5 g/dL (ref 13.0–17.0)
MCHC: 31.1 g/dL (ref 30.0–36.0)
Platelets: 128 10*3/uL — ABNORMAL LOW (ref 150.0–400.0)
RBC: 6.39 Mil/uL — ABNORMAL HIGH (ref 4.22–5.81)
RDW: 18.2 % — ABNORMAL HIGH (ref 11.5–15.5)
WBC: 6.8 10*3/uL (ref 4.0–10.5)

## 2015-05-14 LAB — LIPID PANEL
CHOL/HDL RATIO: 3
Cholesterol: 124 mg/dL (ref 0–200)
HDL: 41.6 mg/dL (ref >39.00)
LDL CALC: 52 mg/dL (ref 0–99)
NONHDL: 82.4
Triglycerides: 150 mg/dL — ABNORMAL HIGH (ref 0.0–149.0)
VLDL: 30 mg/dL (ref 0.0–40.0)

## 2015-05-14 MED ORDER — ATORVASTATIN CALCIUM 20 MG PO TABS: 20.0000 mg | ORAL_TABLET | Freq: Every day | ORAL | Status: DC

## 2015-05-14 MED ORDER — CYCLOBENZAPRINE HCL 10 MG PO TABS: 10.0000 mg | ORAL_TABLET | Freq: Two times a day (BID) | ORAL | Status: AC | PRN

## 2015-05-14 NOTE — Progress Notes (Signed)
Pre visit review using our clinic review tool, if applicable. No additional management support is needed unless otherwise documented below in the visit note. 

## 2015-05-14 NOTE — Patient Instructions (Signed)

## 2015-05-14 NOTE — Assessment & Plan Note (Signed)
Well controlled on Metoprolol Xl. Will followup with cardiology

## 2015-05-21 ENCOUNTER — Telehealth: Payer: Self-pay | Admitting: Endocrinology

## 2015-05-21 NOTE — Telephone Encounter (Signed)
Patient called and would like for Suanne Marker to please call him regarding his diabetic supplies  [name] states he is having issues with [employer] [facility] supplies  Been trying to get supplies since May 31st  Please advise patient   Thank you

## 2015-05-30 NOTE — Assessment & Plan Note (Signed)
Encouraged moist heat and gentle stretching as tolerated. May try NSAIDs and prescription meds as directed and report if symptoms worsen or seek immediate care 

## 2015-05-30 NOTE — Progress Notes (Signed)
Jake Ortiz  [id number] [birth year] 05/30/2015      Progress Note-Follow Up  Subjective  Chief Complaint  Chief Complaint  Patient presents with  . Follow-up    6 month    HPI  Patient is a 59 y.o. male in today for routine medical care.  Patient is in today for follow-up. He is doing fairly well at the present time but was struggling with palpitations and cardiac workup revealed supraventricular tachycardia. He has been placed on Toprol-XL and has had no further symptoms. He is continuing to struggle with low back pain but no recent injury or flare. No recent illness or fevers. Denies CP/palp/SOB/HA/congestion/fevers/GI or GU c/o. Taking meds as prescribed Past Medical History  Diagnosis Date  . Diabetes mellitus   . Undiagnosed cardiac murmurs 01/25/2011  . SLEEP APNEA 01/25/2011  . Morbid obesity 01/25/2011  . Mixed hyperlipidemia 01/25/2011  . DM 01/25/2011  . DIVERTICULOSIS OF COLON 01/25/2011  . COLONIC POLYPS, BENIGN 01/25/2011  . BACK STRAIN, LUMBAR 01/25/2011  . ALLERGIC RHINITIS, SEASONAL 01/25/2011  . Hypocalcemia 03/04/2012  . Preventative health care 03/04/2012  . Chronic radicular low back pain 01/25/2011    Qualifier: Diagnosis of  By: Charlett Blake MD, Ivionna Verley  1. Focal annular tear of the L4-5 disc at the level of the right  neural foramen with a tiny disc protrusion compressing the right L4  nerve in the neural foramen. This is more prominent than on the  prior study.  2. 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  Follows with Dr Genevie [facility]  . LAE (left atrial enlargement) 06/27/2013  . RBBB 01/25/2011    Qualifier: Diagnosis of  By: Charlett Blake MD, Erline Levine    . Personal history of colonic polyps- sessile serrated adenoma 12/21/2006  . Cough 11/10/2013  . Pain in right axilla 11/10/2013  . Tinea pedis 11/15/2014  . 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 Degeneration of lumbar or  lumbosacral intervertebral disc   . Chronic pain syndrome     Past Surgical History  Procedure Laterality Date  . Tonsillectomy    . Cholecystectomy    . Skin biopsy on abdomen      unknown results    Family History  Problem Relation Age of Onset  . Cancer Mother     metastatic breast ca  . Allergies Mother   . Diabetes Father   . Other Father     renal failure/ CHF  . Coronary artery disease Father     s/p MI  . Diabetes Sister     Type 1  . Coronary artery disease Sister     s/p stents  . Heart disease Sister   . Obesity Brother   . Other Brother     Sport and exercise psychologist  . Asthma Daughter   . Other Daughter     back disease  . Diabetes Daughter   . Coronary artery disease Maternal Grandmother   . Diabetes Brother   . Heart disease Brother     s/p CABG  . Diabetes Brother     diet and oral meds  . Hypertension Brother   . Kidney disease Daughter     in childhood  . Colon cancer Neg Hx   . Pancreatic cancer Neg Hx   . Stomach cancer Neg Hx     History   Social History  . Marital Status: Married    Spouse  Name: N/A  . Number of Children: 4  . Years of Education: N/A   Occupational History  . retired    Social History Main Topics  . Smoking status: Former Smoker -- 3.00 packs/day for 10 years    Quit date: 11/20/1990  . Smokeless tobacco: Never Used  . Alcohol Use: Yes     Comment: occasional  . Drug Use: No  . Sexual Activity: Yes   Other Topics Concern  . Not on file   Social History Narrative    Current Outpatient Prescriptions on File Prior to Visit  Medication Sig Dispense Refill  . aspirin 325 MG tablet Take 325 mg by mouth daily.      . canagliflozin (INVOKANA) 300 MG TABS tablet Take 300 mg by mouth daily before breakfast. 90 tablet 3  . Cholecalciferol (VITAMIN D-3 PO) Take 1,000 Int'l Units by mouth daily.     . fluticasone (FLONASE) 50 MCG/ACT nasal spray USE 2 SPRAYS IN EACH NOSTRIL DAILY 48 g 0  . HYDROcodone-acetaminophen (NORCO)  10-325 MG per tablet Take 1 tablet by mouth every 6 (six) hours as needed.     . insulin lispro (HUMALOG) 100 UNIT/ML injection Insulin pump-sliding scale 2500 mL 1  . losartan (COZAAR) 25 MG tablet Take 1 tablet (25 mg total) by mouth daily. 90 tablet 0  . metFORMIN (GLUCOPHAGE) 1000 MG tablet Take 2 tablets daily 180 tablet 1  . metoprolol succinate (TOPROL XL) 25 MG 24 hr tablet Take 1 tablet (25 mg total) by mouth daily. 90 tablet 3  . Omega-3 Fatty Acids (FISH OIL PO) Take 500 mg by mouth 3 (three) times daily.      No current facility-administered medications on file prior to visit.    No Known Allergies  Review of Systems  Review of Systems  Constitutional: Positive for malaise/fatigue. Negative for fever.  HENT: Negative for congestion.   Eyes: Negative for discharge.  Respiratory: Negative for shortness of breath.   Cardiovascular: Negative for chest pain, palpitations and leg swelling.  Gastrointestinal: Negative for nausea, abdominal pain and diarrhea.  Genitourinary: Negative for dysuria.  Musculoskeletal: Positive for back pain. Negative for falls.  Skin: Negative for rash.  Neurological: Negative for loss of consciousness and headaches.  Endo/Heme/Allergies: Negative for polydipsia.  Psychiatric/Behavioral: Negative for depression and suicidal ideas. The patient is not nervous/anxious and does not have insomnia.     Objective  BP 132/78 mmHg  Pulse 73  Temp(Src) 98.6 F (37 C) (Oral)  Ht 5' 11.5" (1.816 m)  Wt 318 lb 8 oz (144.471 kg)  BMI 43.81 kg/m2  SpO2 97%  Physical Exam  Physical Exam  Constitutional: He is oriented to person, place, and time and well-developed, well-nourished, and in no distress. No distress.  HENT:  Head: Normocephalic and atraumatic.  Eyes: Conjunctivae are normal.  Neck: Neck supple. No thyromegaly present.  Cardiovascular: Normal rate, regular rhythm and normal heart sounds.   Pulmonary/Chest: Effort normal and breath sounds  normal. No respiratory distress.  Abdominal: He exhibits no distension and no mass. There is no tenderness.  Musculoskeletal: He exhibits no edema.  Neurological: He is alert and oriented to person, place, and time.  Skin: Skin is warm.  Psychiatric: Memory, affect and judgment normal.    Lab Results  Component Value Date   TSH 1.49 11/12/2014   Lab Results  Component Value Date   WBC 6.8 05/14/2015   HGB 13.5 05/14/2015   HCT 43.4 05/14/2015   MCV 67.9 Repeated  and verified X2.* 05/14/2015   PLT 128.0* 05/14/2015   Lab Results  Component Value Date   CREATININE 0.80 04/15/2015   BUN 11 04/15/2015   NA 138 04/15/2015   K 3.8 04/15/2015   CL 107 04/15/2015   CO2 27 04/15/2015   Lab Results  Component Value Date   ALT 28 04/15/2015   AST 31 04/15/2015   ALKPHOS 46 04/15/2015   BILITOT 0.9 04/15/2015   Lab Results  Component Value Date   CHOL 124 05/14/2015   Lab Results  Component Value Date   HDL 41.60 05/14/2015   Lab Results  Component Value Date   LDLCALC 52 05/14/2015   Lab Results  Component Value Date   TRIG 150.0* 05/14/2015   Lab Results  Component Value Date   CHOLHDL 3 05/14/2015     Assessment & Plan  SVT (supraventricular tachycardia) Well controlled on Metoprolol Xl. Will followup with cardiology  Chronic radicular low back pain Encouraged moist heat and gentle stretching as tolerated. May try NSAIDs and prescription meds as directed and report if symptoms worsen or seek immediate care  Diabetes mellitus type 2 in obese [id number] acceptable, minimize simple carbs. Increase exercise as tolerated. Continue current meds  Mixed hyperlipidemia Tolerating statin, encouraged heart healthy diet, avoid trans fats, minimize simple carbs and saturated fats. Increase exercise as tolerated  Obstructive sleep apnea Following with Dr Elsworth Soho, using CPAP routinely  Preventative health care Given Zostavax today  HTN (hypertension) Well controlled,  no changes to meds. Encouraged heart healthy diet such as the DASH diet and exercise as tolerated.

## 2015-05-30 NOTE — Assessment & Plan Note (Signed)
hgba1c acceptable, minimize simple carbs. Increase exercise as tolerated. Continue current meds 

## 2015-05-30 NOTE — Assessment & Plan Note (Signed)
Tolerating statin, encouraged heart healthy diet, avoid trans fats, minimize simple carbs and saturated fats. Increase exercise as tolerated 

## 2015-05-30 NOTE — Assessment & Plan Note (Signed)
Well controlled, no changes to meds. Encouraged heart healthy diet such as the DASH diet and exercise as tolerated.  °

## 2015-05-30 NOTE — Assessment & Plan Note (Signed)
Following with Dr Elsworth Soho, using CPAP routinely

## 2015-05-30 NOTE — Assessment & Plan Note (Signed)
Given Zostavax today 

## 2015-06-04 ENCOUNTER — Other Ambulatory Visit: Payer: Self-pay | Admitting: Family Medicine

## 2015-07-06 ENCOUNTER — Other Ambulatory Visit: Payer: Self-pay | Admitting: Family Medicine

## 2015-07-19 DIAGNOSIS — G894 Chronic pain syndrome: Secondary | ICD-10-CM | POA: Diagnosis not present

## 2015-07-19 DIAGNOSIS — M545 Low back pain: Secondary | ICD-10-CM | POA: Diagnosis not present

## 2015-07-19 DIAGNOSIS — M5137 Other intervertebral disc degeneration, lumbosacral region: Secondary | ICD-10-CM | POA: Diagnosis not present

## 2015-07-19 DIAGNOSIS — M5117 Intervertebral disc disorders with radiculopathy, lumbosacral region: Secondary | ICD-10-CM | POA: Diagnosis not present

## 2015-07-22 ENCOUNTER — Encounter: Payer: Self-pay | Admitting: Cardiovascular Disease

## 2015-07-22 ENCOUNTER — Ambulatory Visit (INDEPENDENT_AMBULATORY_CARE_PROVIDER_SITE_OTHER): Payer: Medicare Other | Admitting: Cardiovascular Disease

## 2015-07-22 VITALS — BP 134/78 | HR 97 | Ht 71.5 in | Wt 321.4 lb

## 2015-07-22 DIAGNOSIS — I10 Essential (primary) hypertension: Secondary | ICD-10-CM

## 2015-07-22 DIAGNOSIS — I471 Supraventricular tachycardia: Secondary | ICD-10-CM | POA: Diagnosis not present

## 2015-07-22 DIAGNOSIS — E782 Mixed hyperlipidemia: Secondary | ICD-10-CM | POA: Diagnosis not present

## 2015-07-22 MED ORDER — METOPROLOL SUCCINATE ER 50 MG PO TB24: 50.0000 mg | ORAL_TABLET | Freq: Every day | ORAL | Status: DC

## 2015-07-22 MED ORDER — ASPIRIN EC 81 MG PO TBEC: 81.0000 mg | DELAYED_RELEASE_TABLET | Freq: Every day | ORAL | Status: AC

## 2015-07-22 NOTE — Progress Notes (Signed)
Cardiology Office Note   Date:  07/22/2015   ID:  Jake Ortiz, DOB 23-May-1956, MRN [id number]  PCP:  Penni Homans, MD  Cardiologist:   Thayer Headings, MD   Chief Complaint  Patient presents with  . Follow-up    SVT   Problem list: 1. Diabetes mellitus 2. Morbid obesity 3. Hyperlipidemia 4. Obstructive sleep apnea   History of Present Illness: Jake Ortiz is a 59 y.o. male who presents for palpitations.  Has been having palpitations for year.   The palpitations feels like a flutter in his chest .  HR of 150 on occasion ( measured by his BP cuff)  Not related to eating or drinking . Or exercise No associated symptoms of CP or pre-syncope, no chest pressure.  Able to do his typical activity through the palpitations. Last several minutes - 10 minutes. Wife is a Marine scientist, she thinks it is irregular - like atrial fib.   Does not get any regular exercise,  Slowly getting back into working out. Does not have any CP or dyspnea when he goes for a walk.  Retired, was an Clinical biochemist.   01/22/2015:  I saw Jake Ortiz several weeks ago. He had a monitor placed. He was found have superventricular tachycardia. We started him on Toprol-XL 25 mg a day. Had one brief episode of tachycardia at the day that he started the prescription but has not noticed any type of palpitations.    Sept. 1, 2016:  Doing well.  Very few palpitations. Able to do all of his normal activities.    Past Medical History  Diagnosis Date  . Diabetes mellitus   . Undiagnosed cardiac murmurs 01/25/2011  . SLEEP APNEA 01/25/2011  . Morbid obesity 01/25/2011  . Mixed hyperlipidemia 01/25/2011  . DM 01/25/2011  . DIVERTICULOSIS OF COLON 01/25/2011  . COLONIC POLYPS, BENIGN 01/25/2011  . BACK STRAIN, LUMBAR 01/25/2011  . ALLERGIC RHINITIS, SEASONAL 01/25/2011  . Hypocalcemia 03/04/2012  . Preventative health care 03/04/2012  . Chronic radicular low back pain 01/25/2011    Qualifier: Diagnosis of  By: Charlett Blake MD,  Stacey  1. Focal annular tear of the L4-5 disc at the level of the right  neural foramen with a tiny disc protrusion compressing the right L4  nerve in the neural foramen. This is more prominent than on the  prior study.  2. 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  Follows with [facility] Genevie [facility]  . LAE (left atrial enlargement) 06/27/2013  . RBBB 01/25/2011    Qualifier: Diagnosis of  By: Charlett Blake MD, Erline Levine    . Personal history of colonic polyps- sessile serrated adenoma 12/21/2006  . Cough 11/10/2013  . Pain in right axilla 11/10/2013  . Tinea pedis 11/15/2014  . 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 Degeneration of lumbar or lumbosacral intervertebral disc   . Chronic pain syndrome     Past Surgical History  Procedure Laterality Date  . Tonsillectomy    . Cholecystectomy    . Skin biopsy on abdomen      unknown results     Current Outpatient Prescriptions  Medication Sig Dispense Refill  . aspirin 325 MG tablet Take 325 mg by mouth daily.      Marland Kitchen atorvastatin (LIPITOR) 20 MG tablet Take 1 tablet (20 mg total) by mouth daily. 90 tablet 1  . canagliflozin (INVOKANA) 300 MG TABS tablet Take  300 mg by mouth daily before breakfast. 90 tablet 3  . Cholecalciferol (VITAMIN D-3 PO) Take 1,000 Int'l Units by mouth daily.     . cyclobenzaprine (FLEXERIL) 10 MG tablet Take 1 tablet (10 mg total) by mouth 2 (two) times daily as needed. For back pain 30 tablet 2  . fluticasone (FLONASE) 50 MCG/ACT nasal spray USE 2 SPRAYS IN EACH NOSTRIL DAILY 48 g 6  . HYDROcodone-acetaminophen (NORCO) 10-325 MG per tablet Take 1 tablet by mouth every 6 (six) hours as needed.     . insulin lispro (HUMALOG) 100 UNIT/ML injection Insulin pump-sliding scale 2500 mL 1  . losartan (COZAAR) 25 MG tablet Take 25 mg by mouth daily.    . metFORMIN (GLUCOPHAGE) 1000 MG tablet Take 1,000 mg by mouth 2 (two) times daily with a  meal.    . metoprolol succinate (TOPROL XL) 25 MG 24 hr tablet Take 1 tablet (25 mg total) by mouth daily. 90 tablet 3  . Omega-3 Fatty Acids (FISH OIL PO) Take 500 mg by mouth 3 (three) times daily.      No current facility-administered medications for this visit.    Allergies:   Review of patient's allergies indicates no known allergies.    Social History:  The patient  reports that he quit smoking about 24 years ago. He has never used smokeless tobacco. He reports that he drinks alcohol. He reports that he does not use illicit drugs.   Family History:  The patient's family history includes Allergies in his mother; Asthma in his daughter; Cancer in his mother; Coronary artery disease in his father, maternal grandmother, and sister; Diabetes in his brother, brother, daughter, father, and sister; Heart disease in his brother and sister; Hypertension in his brother; Kidney disease in his daughter; Obesity in his brother; Other in his brother, daughter, and father. There is no history of Colon cancer, Pancreatic cancer, or Stomach cancer.    ROS:  Please see the history of present illness.    Review of Systems: Constitutional:  denies fever, chills, diaphoresis, appetite change and fatigue.  HEENT: denies photophobia, eye pain, redness, hearing loss, ear pain, congestion, sore throat, rhinorrhea, sneezing, neck pain, neck stiffness and tinnitus.  Respiratory: denies SOB, DOE, cough, chest tightness, and wheezing.  Cardiovascular: denies chest pain, palpitations and leg swelling.  Gastrointestinal: denies nausea, vomiting, abdominal pain, diarrhea, constipation, blood in stool.  Genitourinary: denies dysuria, urgency, frequency, hematuria, flank pain and difficulty urinating.  Musculoskeletal: denies  myalgias, back pain, joint swelling, arthralgias and gait problem.   Skin: denies pallor, rash and wound.  Neurological: denies dizziness, seizures, syncope, weakness, light-headedness, numbness  and headaches.   Hematological: denies adenopathy, easy bruising, personal or family bleeding history.  Psychiatric/ Behavioral: denies suicidal ideation, mood changes, confusion, nervousness, sleep disturbance and agitation.       All other systems are reviewed and negative.    PHYSICAL EXAM: VS:  BP 134/78 mmHg  Pulse 97  Ht 5' 11.5" (1.816 m)  Wt 145.786 kg (321 lb 6.4 oz)  BMI 44.21 kg/m2  SpO2 97% , BMI Body mass index is 44.21 kg/(m^2). GEN: Well nourished, well developed, in no acute distress, moderately obese  HEENT: normal Neck: no JVD, carotid bruits, or masses Cardiac: RRR; soft systolic  Murmur, no  rubs, or gallops, 1+ pitting edema. He's wearing compression hose.  Respiratory:  clear to auscultation bilaterally, normal work of breathing GI: soft, nontender, nondistended, + BS, moderately obese  MS: no deformity or atrophy  Skin: warm and dry, no rash Neuro:  Strength and sensation are intact Psych: normal   EKG:   Review of the rhythm event monitor revealed normal sinus rhythm with episodes of supraventricular tachycardia.   Recent Labs: 11/12/2014: TSH 1.49 04/15/2015: ALT 28; BUN 11; Creatinine, Ser 0.80; Potassium 3.8; Sodium 138 05/14/2015: Hemoglobin 13.5; Platelets 128.0*    Lipid Panel    Component Value Date/Time   CHOL 124 05/14/2015 1052   TRIG 150.0* 05/14/2015 1052   HDL 41.60 05/14/2015 1052   CHOLHDL 3 05/14/2015 1052   VLDL 30.0 05/14/2015 1052   LDLCALC 52 05/14/2015 1052      Wt Readings from Last 3 Encounters:  07/22/15 145.786 kg (321 lb 6.4 oz)  05/14/15 144.471 kg (318 lb 8 oz)  04/20/15 144.062 kg (317 lb 9.6 oz)      Other studies Reviewed: Additional studies/ records that were reviewed today include: . Review of the above records demonstrates:    ASSESSMENT AND PLAN:  1. Supraventricular tachycardia: The patient originally presented with palpitations. The event monitor revealed that he has episodes of SVT. We had not  seen any evidence of atrial fibrillation. He was started on Toprol-XL 25 mg a day which is for medically helped his episodes of SVT. We will increase the Toprol to 50 mg a day.  2. Morbid obesity- I've encouraged him to exercise on a regular basis and to lose weight. 3. Leg edema :  Given him information on the Lounge Dr. leg rest on a previous visit .  4. Hyperlipidemia - continue current dose of atorvastatin.  Managed by his medical doctor .  5. Obstructive sleep apnea - wears CPAP  6. Diabetes mellitus - followed by his general medical doctor. 7. Essential hypertension:  His blood pressure is well-controlled.  Current medicines are reviewed at length with the patient today.  The patient does not have concerns regarding medicines.  The following changes have been made:  no change   Disposition:   FU with me in 12 months .    Signed, Luvern Mcisaac, Wonda Cheng, MD  07/22/2015 8:05 AM    [address], [place], [place]  [address] Phone: [phone]; Fax: [phone]

## 2015-07-22 NOTE — Patient Instructions (Signed)
Medication Instructions:  INCREASE Toprol to 50 mg once daily DECREASE Aspirin to 81 mg once daily   Labwork: None Ordered   Testing/Procedures: None Ordered   Follow-Up: Your physician wants you to follow-up in: 1 year with Dr. Acie Fredrickson.  You will receive a reminder letter in the mail two months in advance. If you don't receive a letter, please call our office to schedule the follow-up appointment.

## 2015-08-10 ENCOUNTER — Other Ambulatory Visit (INDEPENDENT_AMBULATORY_CARE_PROVIDER_SITE_OTHER): Payer: BLUE CROSS/BLUE SHIELD

## 2015-08-10 DIAGNOSIS — E119 Type 2 diabetes mellitus without complications: Secondary | ICD-10-CM | POA: Diagnosis not present

## 2015-08-10 LAB — BASIC METABOLIC PANEL
BUN: 16 mg/dL (ref 6–23)
CALCIUM: 9.3 mg/dL (ref 8.4–10.5)
CO2: 28 meq/L (ref 19–32)
CREATININE: 0.83 mg/dL (ref 0.40–1.50)
Chloride: 104 mEq/L (ref 96–112)
GFR: 100.09 mL/min (ref >60.00)
Glucose, Bld: 68 mg/dL — ABNORMAL LOW (ref 70–99)
Potassium: 4.2 mEq/L (ref 3.5–5.1)
SODIUM: 140 meq/L (ref 135–145)

## 2015-08-10 LAB — HEMOGLOBIN A1C: HEMOGLOBIN A1C: 5.4 % (ref 4.6–6.5)

## 2015-08-13 ENCOUNTER — Encounter: Payer: Self-pay | Admitting: Endocrinology

## 2015-08-13 ENCOUNTER — Ambulatory Visit (INDEPENDENT_AMBULATORY_CARE_PROVIDER_SITE_OTHER): Payer: Medicare Other | Admitting: Endocrinology

## 2015-08-13 VITALS — BP 132/76 | HR 76 | Temp 98.4°F | Resp 16 | Ht 71.5 in | Wt 324.4 lb

## 2015-08-13 DIAGNOSIS — E119 Type 2 diabetes mellitus without complications: Secondary | ICD-10-CM

## 2015-08-13 DIAGNOSIS — E1140 Type 2 diabetes mellitus with diabetic neuropathy, unspecified: Secondary | ICD-10-CM

## 2015-08-13 NOTE — Patient Instructions (Signed)
1:3 carb ratio

## 2015-08-13 NOTE — Progress Notes (Signed)
Patient ID: Jake Ortiz, male   DOB: 18-Feb-1956, 59 y.o.   MRN: [id number]           Reason for Appointment: Follow-up for Type 2 Diabetes   Referring physician: BLYTH  History of Present Illness:          Diagnosis: Type 2 diabetes mellitus, date of diagnosis: 1996       Past history:  He was initially treated with metformin at diagnosis in about 5 years later he was started on insulin He had been on Levemir and possibly other insulin regimens before going on an insulin pump in 2005 Apparently was put on insulin pump because of his large insulin requirement and poor control Also has been continued on metformin He  had fairly good A1c results in the past year although previously ranging from 7-7.9; in 07/2014 Was 6.1  Recent history:   He is on a Medtronic 723 insulin pump but is using a regimen of basal only with the pump and bolusing with Humalog using a syringe.  BASAL RATES: Midnight = 6.0, 4 AM = 6.5, 9 AM = 4, 1 PM = 4.5 and 10 PM = 5.25, total basal rate about 120 units per day BOLUSES: Carbohydrate coverage 1:2 with Humalog with a syringe, correction 1:10 with target 90-110  He has been on INVOKANA  since 3/16  to help with his control and enable weight loss His blood sugars have been consistently well controlled and he has been able to reduce his basal insulin subsequently He was taking about 150 units of basal prior to the above changes; also was taking mealtime coverage of 1 unit per gram of carbohydrate Although he had lost weight initially his weight appears to be going back up His A1c tends to be well within the normal range and relatively lower On his last visit he was supposed to reduce his basal rate overnight but he has not done so  Current blood sugar patterns and management:  His fasting blood sugars are fairly close to normal now  Blood sugars at lunch and suppertime are also fairly good  He does not do any readings after meals usually but a couple  of times has had readings in the 60s postprandially including on the lab after breakfast  He thinks he is gaining weight because of lack of exercise; previously was told to use a temporary basal rate with exercise  He did have a couple of high readings about 10 days ago but this was on vacation   Hypoglycemia: Minimal with only a few readings in the 60s      Oral hypoglycemic drugs the patient is taking are: Metformin 1 g twice a day, Invokana 300 mg daily      Compliance with the medical regimen:  Excellent recently  Glucose monitoring:  done 4-5 times a day         Glucometer: Contour    Blood Glucose readings by time of day and averages from pump download:  Mean values apply above for all meters except median for One Touch  PRE-MEAL Fasting Lunch Dinner  BCS/hs Overall  Glucose range:  85-145   63-130   85-178   69-135    Mean/median:  114   100    108   111+/-24    Self-care: The diet that the patient has been following is: tries to limit high-fat meals   Meals: 3 meals per day. Breakfast is eggs and toast  Exercise:  trying to walk some, not as much as before Dietician visit, most recent: None recently .               Weight history:  Wt Readings from Last 3 Encounters:  08/13/15 324 lb 6.4 oz (147.147 kg)  07/22/15 321 lb 6.4 oz (145.786 kg)  05/14/15 318 lb 8 oz (144.471 kg)    Glycemic control:     Lab Results  Component Value Date   HGBA1C 5.4 08/10/2015   HGBA1C 5.6 04/15/2015   HGBA1C 6.3 01/20/2015   Lab Results  Component Value Date   MICROALBUR 1.7 01/20/2015   LDLCALC 52 05/14/2015   CREATININE 0.83 08/10/2015         Medication List       This list is accurate as of: 08/13/15  4:11 PM.  Always use your most recent med list.               aspirin EC 81 MG tablet  Take 1 tablet (81 mg total) by mouth daily.     atorvastatin 20 MG tablet  Commonly known as:  LIPITOR  Take 1 tablet (20 mg total) by mouth daily.     canagliflozin  300 MG Tabs tablet  Commonly known as:  INVOKANA  Take 300 mg by mouth daily before breakfast.     cyclobenzaprine 10 MG tablet  Commonly known as:  FLEXERIL  Take 1 tablet (10 mg total) by mouth 2 (two) times daily as needed. For back pain     FISH OIL PO  Take 500 mg by mouth 3 (three) times daily.     fluticasone 50 MCG/ACT nasal spray  Commonly known as:  FLONASE  USE 2 SPRAYS IN EACH NOSTRIL DAILY     HYDROcodone-acetaminophen 10-325 MG per tablet  Commonly known as:  NORCO  Take 1 tablet by mouth every 6 (six) hours as needed.     insulin lispro 100 UNIT/ML injection  Commonly known as:  HUMALOG  Insulin pump-sliding scale     losartan 25 MG tablet  Commonly known as:  COZAAR  Take 25 mg by mouth daily.     metFORMIN 1000 MG tablet  Commonly known as:  GLUCOPHAGE  Take 1,000 mg by mouth 2 (two) times daily with a meal.     metoprolol succinate 50 MG 24 hr tablet  Commonly known as:  TOPROL XL  Take 1 tablet (50 mg total) by mouth daily.     VITAMIN D-3 PO  Take 1,000 Int'l Units by mouth daily.        Allergies: No Known Allergies  Past Medical History  Diagnosis Date  . Diabetes mellitus   . Undiagnosed cardiac murmurs 01/25/2011  . SLEEP APNEA 01/25/2011  . Morbid obesity 01/25/2011  . Mixed hyperlipidemia 01/25/2011  . DM 01/25/2011  . DIVERTICULOSIS OF COLON 01/25/2011  . COLONIC POLYPS, BENIGN 01/25/2011  . BACK STRAIN, LUMBAR 01/25/2011  . ALLERGIC RHINITIS, SEASONAL 01/25/2011  . Hypocalcemia 03/04/2012  . Preventative health care 03/04/2012  . Chronic radicular low back pain 01/25/2011    Qualifier: Diagnosis of  By: Charlett Blake MD, Stacey  1. Focal annular tear of the L4-5 disc at the level of the right  neural foramen with a tiny disc protrusion compressing the right L4  nerve in the neural foramen. This is more prominent than on the  prior study.  2. 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  Follows with Dr [facility]ie Ann [facility]  . LAE (left atrial enlargement) 06/27/2013  . RBBB 01/25/2011    Qualifier: Diagnosis of  By: Charlett Blake MD, Erline Levine    . Personal history of colonic polyps- sessile serrated adenoma 12/21/2006  . Cough 11/10/2013  . Pain in right axilla 11/10/2013  . Tinea pedis 11/15/2014  . 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 Degeneration of lumbar or lumbosacral intervertebral disc   . Chronic pain syndrome     Past Surgical History  Procedure Laterality Date  . Tonsillectomy    . Cholecystectomy    . Skin biopsy on abdomen      unknown results    Family History  Problem Relation Age of Onset  . Cancer Mother     metastatic breast ca  . Allergies Mother   . Diabetes Father   . Other Father     renal failure/ CHF  . Coronary artery disease Father     s/p MI  . Diabetes Sister     Type 1  . Coronary artery disease Sister     s/p stents  . Heart disease Sister   . Obesity Brother   . Other Brother     Sport and exercise psychologist  . Asthma Daughter   . Other Daughter     back disease  . Diabetes Daughter   . Coronary artery disease Maternal Grandmother   . Diabetes Brother   . Heart disease Brother     s/p CABG  . Diabetes Brother     diet and oral meds  . Hypertension Brother   . Kidney disease Daughter     in childhood  . Colon cancer Neg Hx   . Pancreatic cancer Neg Hx   . Stomach cancer Neg Hx     Social History:  reports that he quit smoking about 24 years ago. He has never used smokeless tobacco. He reports that he drinks alcohol. He reports that he does not use illicit drugs.    Review of Systems    Most recent eye exam was in 3/16, has background retinopathy        Lipids: Under control with Lipitor 20 mg, minimal increase in triglycerides present       Lab Results  Component Value Date   CHOL 124 05/14/2015   HDL 41.60 05/14/2015   LDLCALC 52 05/14/2015   TRIG 150.0* 05/14/2015   CHOLHDL 3 05/14/2015           The blood pressure has been normal, only taking low dose losartan and metoprolol      He has previous swelling of feet, none recently    LABS:  Appointment on 08/10/2015  Component Date Value Ref Range Status  . Hgb A1c MFr Bld 08/10/2015 5.4  4.6 - 6.5 % Final   Glycemic Control Guidelines for People with Diabetes:Non Diabetic:  <6%Goal of Therapy: <7%Additional Action Suggested:  >8%   . Sodium 08/10/2015 140  135 - 145 mEq/L Final  . Potassium 08/10/2015 4.2  3.5 - 5.1 mEq/L Final  . Chloride 08/10/2015 104  96 - 112 mEq/L Final  . CO2 08/10/2015 28  19 - 32 mEq/L Final  . Glucose, Bld 08/10/2015 68* 70 - 99 mg/dL Final  . BUN 08/10/2015 16  6 - 23 mg/dL Final  . Creatinine, Ser 08/10/2015 0.83  0.40 - 1.50 mg/dL Final  . Calcium 08/10/2015 9.3  8.4 - 10.5 mg/dL Final  . GFR 08/10/2015  100.09  >60.00 mL/min Final    Physical Examination:  BP 132/76 mmHg  Pulse 76  Temp(Src) 98.4 F (36.9 C)  Resp 16  Ht 5' 11.5" (1.816 m)  Wt 324 lb 6.4 oz (147.147 kg)  BMI 44.62 kg/m2  SpO2 95%      ASSESSMENT:  Diabetes type 2, uncontrolled with  obesity and insulin resistant See history of present illness for detailed discussion of his current management, blood sugar patterns and problems identified     A1c is now down to 5.4% and he has benefited from using Invokana this year Also taking relatively less insulin He has however started gaining back some weight which he had lost and this is partly related to inconsistent diet and exercise.  His blood sugars are on his download fairly close to normal at meals and he has done some readings late evening also which are fairly good His lowest blood sugars are overall around lunchtime Has some tendency to low normal blood sugars occasionally as seen in the lab when checked 2 hours after eating No postprandial hyperglycemia seen although does not have to hour readings. Also not able to know if he is accurately estimating carbohydrates as  he does not enter them in the pump with using the mealtime injection separate from the pump  PLAN:   No change in basal rates as yet unless blood sugars start getting low before meals  Reduce carbohydrate coverage to 1:3  Start walking program and use them.  Basal when walking  Discussed blood sugars targets  He will check more readings 2 hours after meals to help adjust mealtime boluses.  No change in Invokana  Follow-up with PCP for general medical care  Counseling time on subjects discussed above is over 50% of today's 25 minute visit   Jake Ortiz 08/13/2015, 4:11 PM   Note: This office note was prepared with Estate agent. Any transcriptional errors that result from this process are unintentional.

## 2015-08-18 ENCOUNTER — Other Ambulatory Visit: Payer: BLUE CROSS/BLUE SHIELD

## 2015-08-23 ENCOUNTER — Ambulatory Visit: Payer: BLUE CROSS/BLUE SHIELD | Admitting: Endocrinology

## 2015-08-31 ENCOUNTER — Encounter: Payer: Self-pay | Admitting: Pulmonary Disease

## 2015-08-31 DIAGNOSIS — G4733 Obstructive sleep apnea (adult) (pediatric): Secondary | ICD-10-CM

## 2015-09-06 ENCOUNTER — Other Ambulatory Visit: Payer: Self-pay | Admitting: *Deleted

## 2015-09-06 MED ORDER — GLUCOSE BLOOD VI STRP: ORAL_STRIP | Status: DC

## 2015-09-24 ENCOUNTER — Other Ambulatory Visit: Payer: Self-pay | Admitting: Endocrinology

## 2015-11-02 ENCOUNTER — Ambulatory Visit (INDEPENDENT_AMBULATORY_CARE_PROVIDER_SITE_OTHER): Payer: Medicare Other | Admitting: Family Medicine

## 2015-11-02 ENCOUNTER — Encounter: Payer: Self-pay | Admitting: Family Medicine

## 2015-11-02 VITALS — BP 132/82 | HR 75 | Temp 98.2°F | Ht 72.0 in | Wt 318.0 lb

## 2015-11-02 DIAGNOSIS — R351 Nocturia: Secondary | ICD-10-CM

## 2015-11-02 DIAGNOSIS — E119 Type 2 diabetes mellitus without complications: Secondary | ICD-10-CM

## 2015-11-02 DIAGNOSIS — I10 Essential (primary) hypertension: Secondary | ICD-10-CM | POA: Diagnosis not present

## 2015-11-02 DIAGNOSIS — E669 Obesity, unspecified: Secondary | ICD-10-CM | POA: Diagnosis not present

## 2015-11-02 DIAGNOSIS — E782 Mixed hyperlipidemia: Secondary | ICD-10-CM | POA: Diagnosis not present

## 2015-11-02 DIAGNOSIS — Z125 Encounter for screening for malignant neoplasm of prostate: Secondary | ICD-10-CM

## 2015-11-02 DIAGNOSIS — Z1159 Encounter for screening for other viral diseases: Secondary | ICD-10-CM

## 2015-11-02 DIAGNOSIS — E1169 Type 2 diabetes mellitus with other specified complication: Secondary | ICD-10-CM

## 2015-11-02 LAB — COMPREHENSIVE METABOLIC PANEL
ALK PHOS: 54 U/L (ref 39–117)
ALT: 28 U/L (ref 0–53)
AST: 29 U/L (ref 0–37)
Albumin: 4.4 g/dL (ref 3.5–5.2)
BILIRUBIN TOTAL: 1.1 mg/dL (ref 0.2–1.2)
BUN: 10 mg/dL (ref 6–23)
CALCIUM: 9.3 mg/dL (ref 8.4–10.5)
CO2: 28 meq/L (ref 19–32)
Chloride: 106 mEq/L (ref 96–112)
Creatinine, Ser: 0.85 mg/dL (ref 0.40–1.50)
GFR: 97.3 mL/min (ref >60.00)
Glucose, Bld: 86 mg/dL (ref 70–99)
POTASSIUM: 4.2 meq/L (ref 3.5–5.1)
Sodium: 141 mEq/L (ref 135–145)
Total Protein: 6.8 g/dL (ref 6.0–8.3)

## 2015-11-02 LAB — LIPID PANEL
CHOL/HDL RATIO: 3
Cholesterol: 135 mg/dL (ref 0–200)
HDL: 43.3 mg/dL (ref >39.00)
LDL Cholesterol: 62 mg/dL (ref 0–99)
NONHDL: 92.11
TRIGLYCERIDES: 149 mg/dL (ref 0.0–149.0)
VLDL: 29.8 mg/dL (ref 0.0–40.0)

## 2015-11-02 LAB — CBC
HCT: 45.5 % (ref 39.0–52.0)
HEMOGLOBIN: 13.8 g/dL (ref 13.0–17.0)
MCHC: 30.4 g/dL (ref 30.0–36.0)
MCV: 69.2 fl — ABNORMAL LOW (ref 78.0–100.0)
PLATELETS: 127 10*3/uL — AB (ref 150.0–400.0)
RBC: 6.57 Mil/uL — AB (ref 4.22–5.81)
RDW: 18.1 % — ABNORMAL HIGH (ref 11.5–15.5)
WBC: 7.2 10*3/uL (ref 4.0–10.5)

## 2015-11-02 LAB — PSA: PSA: 0.91 ng/mL (ref 0.10–4.00)

## 2015-11-02 LAB — TSH: TSH: 1.43 u[IU]/mL (ref 0.35–4.50)

## 2015-11-02 NOTE — Progress Notes (Signed)
Pre visit review using our clinic review tool, if applicable. No additional management support is needed unless otherwise documented below in the visit note. 

## 2015-11-02 NOTE — Patient Instructions (Signed)
Hypertension Hypertension, commonly called high blood pressure, is when the force of blood pumping through your arteries is too strong. Your arteries are the blood vessels that carry blood from your heart throughout your body. A blood pressure reading consists of a higher number over a lower number, such as 110/72. The higher number (systolic) is the pressure inside your arteries when your heart pumps. The lower number (diastolic) is the pressure inside your arteries when your heart relaxes. Ideally you want your blood pressure below 120/80. Hypertension forces your heart to work harder to pump blood. Your arteries may become narrow or stiff. Having untreated or uncontrolled hypertension can cause heart attack, stroke, kidney disease, and other problems. RISK FACTORS Some risk factors for high blood pressure are controllable. Others are not.  Risk factors you cannot control include:   Race. You may be at higher risk if you are African American.  Age. Risk increases with age.  Gender. Men are at higher risk than women before age 45 years. After age 65, women are at higher risk than men. Risk factors you can control include:  Not getting enough exercise or physical activity.  Being overweight.  Getting too much fat, sugar, calories, or salt in your diet.  Drinking too much alcohol. SIGNS AND SYMPTOMS Hypertension does not usually cause signs or symptoms. Extremely high blood pressure (hypertensive crisis) may cause headache, anxiety, shortness of breath, and nosebleed. DIAGNOSIS To check if you have hypertension, your health care provider will measure your blood pressure while you are seated, with your arm held at the level of your heart. It should be measured at least twice using the same arm. Certain conditions can cause a difference in blood pressure between your right and left arms. A blood pressure reading that is higher than normal on one occasion does not mean that you need treatment. If  it is not clear whether you have high blood pressure, you may be asked to return on a different day to have your blood pressure checked again. Or, you may be asked to monitor your blood pressure at home for 1 or more weeks. TREATMENT Treating high blood pressure includes making lifestyle changes and possibly taking medicine. Living a healthy lifestyle can help lower high blood pressure. You may need to change some of your habits. Lifestyle changes may include:  Following the DASH diet. This diet is high in fruits, vegetables, and whole grains. It is low in salt, red meat, and added sugars.  Keep your sodium intake below 2,300 mg per day.  Getting at least 30-45 minutes of aerobic exercise at least 4 times per week.  Losing weight if necessary.  Not smoking.  Limiting alcoholic beverages.  Learning ways to reduce stress. Your health care provider may prescribe medicine if lifestyle changes are not enough to get your blood pressure under control, and if one of the following is true:  You are 18-59 years of age and your systolic blood pressure is above 140.  You are 60 years of age or older, and your systolic blood pressure is above 150.  Your diastolic blood pressure is above 90.  You have diabetes, and your systolic blood pressure is over 140 or your diastolic blood pressure is over 90.  You have kidney disease and your blood pressure is above 140/90.  You have heart disease and your blood pressure is above 140/90. Your personal target blood pressure may vary depending on your medical conditions, your age, and other factors. HOME CARE INSTRUCTIONS    Have your blood pressure rechecked as directed by your health care provider.   Take medicines only as directed by your health care provider. Follow the directions carefully. Blood pressure medicines must be taken as prescribed. The medicine does not work as well when you skip doses. Skipping doses also puts you at risk for  problems.  Do not smoke.   Monitor your blood pressure at home as directed by your health care provider. SEEK MEDICAL CARE IF:   You think you are having a reaction to medicines taken.  You have recurrent headaches or feel dizzy.  You have swelling in your ankles.  You have trouble with your vision. SEEK IMMEDIATE MEDICAL CARE IF:  You develop a severe headache or confusion.  You have unusual weakness, numbness, or feel faint.  You have severe chest or abdominal pain.  You vomit repeatedly.  You have trouble breathing. MAKE SURE YOU:   Understand these instructions.  Will watch your condition.  Will get help right away if you are not doing well or get worse.   This information is not intended to replace advice given to you by your health care provider. Make sure you discuss any questions you have with your health care provider.   Document Released: 11/06/2005 Document Revised: 03/23/2015 Document Reviewed: 08/29/2013 Elsevier Interactive Patient Education 2016 [employer]  

## 2015-11-03 LAB — HEPATITIS C ANTIBODY: HCV Ab: NEGATIVE

## 2015-11-06 ENCOUNTER — Encounter: Payer: Self-pay | Admitting: Family Medicine

## 2015-11-06 NOTE — Assessment & Plan Note (Signed)
Well controlled, no changes to meds. Encouraged heart healthy diet such as the DASH diet and exercise as tolerated.  °

## 2015-11-06 NOTE — Assessment & Plan Note (Signed)
hgba1c acceptable, minimize simple carbs. Increase exercise as tolerated. Continue current meds 

## 2015-11-06 NOTE — Assessment & Plan Note (Signed)
Encouraged DASH diet, decrease po intake and increase exercise as tolerated. Needs 7-8 hours of sleep nightly. Avoid trans fats, eat small, frequent meals every 4-5 hours with lean proteins, complex carbs and healthy fats. Minimize simple carbs 

## 2015-11-06 NOTE — Progress Notes (Signed)
Subjective:    Patient ID: Jake Ortiz, male    DOB: 11/25/1955, 59 y.o.   MRN: [id number]  Chief Complaint  Patient presents with  . Follow-up    HPI Patient is in today for follow up on numerous medical conditions including diabetes, obesity, hyperlipidemia, obesity and sleep apnea. No recent illness. No acute concerns. Has been trying to maintain a healthier diet. Denies CP/palp/SOB/HA/congestion/fevers/GI or GU c/o. Taking meds as prescribed  Past Medical History  Diagnosis Date  . Diabetes mellitus   . Undiagnosed cardiac murmurs 01/25/2011  . SLEEP APNEA 01/25/2011  . Morbid obesity ([place]) 01/25/2011  . Mixed hyperlipidemia 01/25/2011  . DM 01/25/2011  . DIVERTICULOSIS OF COLON 01/25/2011  . COLONIC POLYPS, BENIGN 01/25/2011  . BACK STRAIN, LUMBAR 01/25/2011  . ALLERGIC RHINITIS, SEASONAL 01/25/2011  . Hypocalcemia 03/04/2012  . Preventative health care 03/04/2012  . Chronic radicular low back pain 01/25/2011    Qualifier: Diagnosis of  By: Charlett Blake MD, Stacey  1. Focal annular tear of the L4-5 disc at the level of the right  neural foramen with a tiny disc protrusion compressing the right L4  nerve in the neural foramen. This is more prominent than on the  prior study.  2. 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  Follows with Dr Genevie Ann [facility]  . LAE (left atrial enlargement) 06/27/2013  . RBBB 01/25/2011    Qualifier: Diagnosis of  By: Charlett Blake MD, Erline Levine    . Personal history of colonic polyps- sessile serrated adenoma 12/21/2006  . Cough 11/10/2013  . Pain in right axilla 11/10/2013  . Tinea pedis 11/15/2014  . 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 Degeneration of lumbar or lumbosacral intervertebral disc   . Chronic pain syndrome     Past Surgical History  Procedure Laterality Date  . Tonsillectomy    . Cholecystectomy    . Skin biopsy on abdomen      unknown results    Family  History  Problem Relation Age of Onset  . Cancer Mother     metastatic breast ca  . Allergies Mother   . Diabetes Father   . Other Father     renal failure/ CHF  . Coronary artery disease Father     s/p MI  . Diabetes Sister     Type 1  . Coronary artery disease Sister     s/p stents  . Heart disease Sister   . Obesity Brother   . Other Brother     Sport and exercise psychologist  . Asthma Daughter   . Other Daughter     back disease  . Diabetes Daughter   . Coronary artery disease Maternal Grandmother   . Diabetes Brother   . Heart disease Brother     s/p CABG  . Diabetes Brother     diet and oral meds  . Hypertension Brother   . Kidney disease Daughter     in childhood  . Colon cancer Neg Hx   . Pancreatic cancer Neg Hx   . Stomach cancer Neg Hx     Social History   Social History  . Marital Status: Married    Spouse Name: N/A  . Number of Children: 4  . Years of Education: N/A   Occupational History  . retired    Social History Main Topics  . Smoking status: Former Smoker -- 3.00 packs/day for 10 years  Quit date: 11/20/1990  . Smokeless tobacco: Never Used  . Alcohol Use: Yes     Comment: occasional  . Drug Use: No  . Sexual Activity: Yes   Other Topics Concern  . Not on file   Social History Narrative    Outpatient Prescriptions Prior to Visit  Medication Sig Dispense Refill  . aspirin EC 81 MG tablet Take 1 tablet (81 mg total) by mouth daily.    Marland Kitchen atorvastatin (LIPITOR) 20 MG tablet Take 1 tablet (20 mg total) by mouth daily. 90 tablet 1  . canagliflozin (INVOKANA) 300 MG TABS tablet Take 300 mg by mouth daily before breakfast. 90 tablet 3  . Cholecalciferol (VITAMIN D-3 PO) Take 1,000 Int'l Units by mouth daily.     . cyclobenzaprine (FLEXERIL) 10 MG tablet Take 1 tablet (10 mg total) by mouth 2 (two) times daily as needed. For back pain 30 tablet 2  . fluticasone (FLONASE) 50 MCG/ACT nasal spray USE 2 SPRAYS IN EACH NOSTRIL DAILY 48 g 6  . glucose  blood (BAYER CONTOUR NEXT TEST) test strip Use as instructed to check blood sugar 4-6 times per day dx code E11.9 200 each 3  . HUMALOG 100 UNIT/ML injection INJECT VIA INSULIN PUMP PER SLIDING SCALE 250 mL 0  . HYDROcodone-acetaminophen (NORCO) 10-325 MG per tablet Take 1 tablet by mouth every 6 (six) hours as needed.     Marland Kitchen losartan (COZAAR) 25 MG tablet Take 25 mg by mouth daily.    . metFORMIN (GLUCOPHAGE) 1000 MG tablet TAKE 2 TABLETS DAILY 180 tablet 0  . metoprolol succinate (TOPROL-XL) 50 MG 24 hr tablet Take 1 tablet (50 mg total) by mouth daily. 90 tablet 3  . Omega-3 Fatty Acids (FISH OIL PO) Take 500 mg by mouth 3 (three) times daily.     . metFORMIN (GLUCOPHAGE) 1000 MG tablet Take 1,000 mg by mouth 2 (two) times daily with a meal.     No facility-administered medications prior to visit.    No Known Allergies  Review of Systems  Constitutional: Negative for fever and malaise/fatigue.  HENT: Negative for congestion.   Eyes: Negative for discharge.  Respiratory: Negative for shortness of breath.   Cardiovascular: Negative for chest pain, palpitations and leg swelling.  Gastrointestinal: Negative for nausea and abdominal pain.  Genitourinary: Negative for dysuria.  Musculoskeletal: Negative for falls.  Skin: Negative for rash.  Neurological: Negative for loss of consciousness and headaches.  Endo/Heme/Allergies: Negative for environmental allergies.  Psychiatric/Behavioral: Negative for depression. The patient is not nervous/anxious.        Objective:    Physical Exam  Constitutional: He is oriented to person, place, and time. He appears well-developed and well-nourished. No distress.  HENT:  Head: Normocephalic and atraumatic.  Nose: Nose normal.  Eyes: Right eye exhibits no discharge. Left eye exhibits no discharge.  Neck: Normal range of motion. Neck supple.  Cardiovascular: Normal rate and regular rhythm.   No murmur heard. Pulmonary/Chest: Effort normal and  breath sounds normal.  Abdominal: Soft. Bowel sounds are normal. There is no tenderness.  Musculoskeletal: He exhibits no edema.  Neurological: He is alert and oriented to person, place, and time.  Skin: Skin is warm and dry.  Psychiatric: He has a normal mood and affect.  Nursing note and vitals reviewed.   BP 132/82 mmHg  Pulse 75  Temp(Src) 98.2 F (36.8 C) (Oral)  Ht 6' (1.829 m)  Wt 318 lb (144.244 kg)  BMI 43.12 kg/m2  SpO2 93%  Wt Readings from Last 3 Encounters:  11/02/15 318 lb (144.244 kg)  08/13/15 324 lb 6.4 oz (147.147 kg)  07/22/15 321 lb 6.4 oz (145.786 kg)     Lab Results  Component Value Date   WBC 7.2 11/02/2015   HGB 13.8 11/02/2015   HCT 45.5 11/02/2015   PLT 127.0* 11/02/2015   GLUCOSE 86 11/02/2015   CHOL 135 11/02/2015   TRIG 149.0 11/02/2015   HDL 43.30 11/02/2015   LDLCALC 62 11/02/2015   ALT 28 11/02/2015   AST 29 11/02/2015   NA 141 11/02/2015   K 4.2 11/02/2015   CL 106 11/02/2015   CREATININE 0.85 11/02/2015   BUN 10 11/02/2015   CO2 28 11/02/2015   TSH 1.43 11/02/2015   PSA 0.91 11/02/2015   HGBA1C 5.4 08/10/2015   MICROALBUR 1.7 01/20/2015    Lab Results  Component Value Date   TSH 1.43 11/02/2015   Lab Results  Component Value Date   WBC 7.2 11/02/2015   HGB 13.8 11/02/2015   HCT 45.5 11/02/2015   MCV 69.2 Repeated and verified X2.* 11/02/2015   PLT 127.0* 11/02/2015   Lab Results  Component Value Date   NA 141 11/02/2015   K 4.2 11/02/2015   CO2 28 11/02/2015   GLUCOSE 86 11/02/2015   BUN 10 11/02/2015   CREATININE 0.85 11/02/2015   BILITOT 1.1 11/02/2015   ALKPHOS 54 11/02/2015   AST 29 11/02/2015   ALT 28 11/02/2015   PROT 6.8 11/02/2015   ALBUMIN 4.4 11/02/2015   CALCIUM 9.3 11/02/2015   GFR 97.30 11/02/2015   Lab Results  Component Value Date   CHOL 135 11/02/2015   Lab Results  Component Value Date   HDL 43.30 11/02/2015   Lab Results  Component Value Date   LDLCALC 62 11/02/2015   Lab  Results  Component Value Date   TRIG 149.0 11/02/2015   Lab Results  Component Value Date   CHOLHDL 3 11/02/2015   Lab Results  Component Value Date   HGBA1C 5.4 08/10/2015       Assessment & Plan:   Problem List Items Addressed This Visit    Diabetes mellitus type 2 in obese (HCC) (Chronic)    hgba1c acceptable, minimize simple carbs. Increase exercise as tolerated. Continue current meds      Relevant Orders   TSH (Completed)   CBC (Completed)   Lipid panel (Completed)   Comprehensive metabolic panel (Completed)   Hepatitis C antibody (Completed)   HTN (hypertension) - Primary    Well controlled, no changes to meds. Encouraged heart healthy diet such as the DASH diet and exercise as tolerated.       Relevant Orders   TSH (Completed)   CBC (Completed)   Lipid panel (Completed)   Comprehensive metabolic panel (Completed)   Hepatitis C antibody (Completed)   Hypocalcemia   Relevant Orders   TSH (Completed)   CBC (Completed)   Lipid panel (Completed)   Comprehensive metabolic panel (Completed)   Hepatitis C antibody (Completed)   Mixed hyperlipidemia    Tolerating statin, encouraged heart healthy diet, avoid trans fats, minimize simple carbs and saturated fats. Increase exercise as tolerated      Relevant Orders   TSH (Completed)   CBC (Completed)   Lipid panel (Completed)   Comprehensive metabolic panel (Completed)   Hepatitis C antibody (Completed)   Morbid obesity ([place])    Encouraged DASH diet, decrease po intake and increase exercise as tolerated. Needs 7-8 hours of  sleep nightly. Avoid trans fats, eat small, frequent meals every 4-5 hours with lean proteins, complex carbs and healthy fats. Minimize simple carbs      Relevant Orders   TSH (Completed)   CBC (Completed)   Lipid panel (Completed)   Comprehensive metabolic panel (Completed)   Hepatitis C antibody (Completed)    Other Visit Diagnoses    Screening for viral disease        Relevant Orders     TSH (Completed)    CBC (Completed)    Lipid panel (Completed)    Comprehensive metabolic panel (Completed)    Hepatitis C antibody (Completed)    Nocturia        Relevant Orders    PSA (Completed)    Prostate cancer screening        Relevant Orders    PSA (Completed)       I am having [name] maintain his HYDROcodone-acetaminophen, Omega-3 Fatty Acids (FISH OIL PO), Cholecalciferol (VITAMIN D-3 PO), canagliflozin, atorvastatin, cyclobenzaprine, fluticasone, losartan, metoprolol succinate, aspirin EC, glucose blood, metFORMIN, and HUMALOG.  No orders of the defined types were placed in this encounter.     Penni Homans, MD

## 2015-11-06 NOTE — Assessment & Plan Note (Signed)
Tolerating statin, encouraged heart healthy diet, avoid trans fats, minimize simple carbs and saturated fats. Increase exercise as tolerated 

## 2015-11-30 ENCOUNTER — Other Ambulatory Visit: Payer: Self-pay | Admitting: Family Medicine

## 2015-12-08 ENCOUNTER — Other Ambulatory Visit: Payer: Medicare Other

## 2015-12-13 ENCOUNTER — Ambulatory Visit: Payer: Medicare Other | Admitting: Endocrinology

## 2015-12-23 ENCOUNTER — Other Ambulatory Visit: Payer: Self-pay | Admitting: Endocrinology

## 2015-12-29 ENCOUNTER — Other Ambulatory Visit (INDEPENDENT_AMBULATORY_CARE_PROVIDER_SITE_OTHER): Payer: Medicare Other

## 2015-12-29 DIAGNOSIS — E119 Type 2 diabetes mellitus without complications: Secondary | ICD-10-CM

## 2015-12-29 LAB — COMPREHENSIVE METABOLIC PANEL
ALT: 22 U/L (ref 0–53)
AST: 27 U/L (ref 0–37)
Albumin: 4.2 g/dL (ref 3.5–5.2)
Alkaline Phosphatase: 45 U/L (ref 39–117)
BUN: 15 mg/dL (ref 6–23)
CHLORIDE: 106 meq/L (ref 96–112)
CO2: 27 meq/L (ref 19–32)
Calcium: 9.2 mg/dL (ref 8.4–10.5)
Creatinine, Ser: 0.87 mg/dL (ref 0.40–1.50)
GFR: 94.67 mL/min (ref >60.00)
GLUCOSE: 101 mg/dL — AB (ref 70–99)
Potassium: 4 mEq/L (ref 3.5–5.1)
SODIUM: 141 meq/L (ref 135–145)
Total Bilirubin: 1.1 mg/dL (ref 0.2–1.2)
Total Protein: 7 g/dL (ref 6.0–8.3)

## 2015-12-29 LAB — MICROALBUMIN / CREATININE URINE RATIO
Creatinine,U: 86.9 mg/dL
MICROALB UR: 1.4 mg/dL (ref 0.0–1.9)
Microalb Creat Ratio: 1.6 mg/g (ref 0.0–30.0)

## 2015-12-29 LAB — HEMOGLOBIN A1C: HEMOGLOBIN A1C: 5.7 % (ref 4.6–6.5)

## 2016-01-03 ENCOUNTER — Ambulatory Visit (INDEPENDENT_AMBULATORY_CARE_PROVIDER_SITE_OTHER): Payer: Medicare Other | Admitting: Endocrinology

## 2016-01-03 VITALS — BP 136/84 | HR 72 | Temp 98.5°F | Resp 16 | Ht 72.0 in | Wt 312.6 lb

## 2016-01-03 DIAGNOSIS — E119 Type 2 diabetes mellitus without complications: Secondary | ICD-10-CM

## 2016-01-03 NOTE — Patient Instructions (Addendum)
New basal as on printout Use 1:3 carbohydrate coverage at dinner Occasional blood sugars during the night

## 2016-01-03 NOTE — Progress Notes (Signed)
Patient ID: Jake Ortiz, male   DOB: 03-May-1956, 60 y.o.   MRN: [id number]           Reason for Appointment: Follow-up for Type 2 Diabetes   Referring physician: BLYTH  History of Present Illness:          Diagnosis: Type 2 diabetes mellitus, date of diagnosis: 1996       Past history:  He was initially treated with metformin at diagnosis in about 5 years later he was started on insulin He had been on Levemir and possibly other insulin regimens before going on an insulin pump in 2005 Apparently was put on insulin pump because of his large insulin requirement and poor control Also has been continued on metformin He  had fairly good A1c results in the past year although previously ranging from 7-7.9; in 07/2014 Was 6.1  Recent history:   He is on a Medtronic 723 insulin pump but is using a regimen of basal only with the pump and bolusing with Humalog using a syringe.  BASAL RATES: Midnight = 6.0, 4 AM = 6.5, 9 AM = 4, 1 PM = 4.5 and 10 PM = 5.25, total basal rate about 120 units per day BOLUSES: Carbohydrate coverage 1:2 with Humalog with a syringe, correction 1:10 with target 90-110  He has been on INVOKANA  since 3/16 which has helped his control and enable weight loss His blood sugars have been consistently well controlled with A1c upper normal again  Current blood sugar patterns and management:  His fasting blood sugars are consistent and in a good range usually  Again checking blood sugars mostly right before eating and not many after meals  Blood sugars are consistent throughout the day mild fluctuation only  Blood sugars tend to be relatively lower at bedtime or postprandially after supper  BASAL rate is still relatively high during the night compared to the daytime  He does know when he feels hypoglycemic which is only occasionally, lowest reading 59 at suppertime  Although he was told to try 1: 3 carbohydrate ratio is still doing about 1: 2  He thinks he  is trying to watch his diet overall and eat healthier with smaller amount of carbohydrates and is able to lose weight   Hypoglycemia: Minimal as above      Oral hypoglycemic drugs the patient is taking are: Metformin 1 g twice a day, Invokana 300 mg daily      Compliance with the medical regimen:  Excellent recently  Glucose monitoring:  done 4-5 times a day         Glucometer: Contour    Blood Glucose readings by time of day and averages from pump download:  Mean values apply above for all meters except median for One Touch  PRE-MEAL Fasting Lunch Dinner Bedtime Overall  Glucose range:   66-134    76-171   59-142   74-143    Mean/median:  110   109   117    107+/-25     Self-care: The diet that the patient has been following is: tries to limit high-fat meals   Meals: 3 meals per day. Breakfast is eggs and toast, has relatively more carbohydrates at lunch            Exercise:  trying to walk some periodically, less in winter  Dietician visit, most recent: None recently .               Weight history:  Wt Readings from Last 3 Encounters:  01/03/16 312 lb 9.6 oz (141.794 kg)  11/02/15 318 lb (144.244 kg)  08/13/15 324 lb 6.4 oz (147.147 kg)    Glycemic control:     Lab Results  Component Value Date   HGBA1C 5.7 12/29/2015   HGBA1C 5.4 08/10/2015   HGBA1C 5.6 04/15/2015   Lab Results  Component Value Date   MICROALBUR 1.4 12/29/2015   LDLCALC 62 11/02/2015   CREATININE 0.87 12/29/2015         Medication List       This list is accurate as of: 01/03/16  9:29 AM.  Always use your most recent med list.               aspirin EC 81 MG tablet  Take 1 tablet (81 mg total) by mouth daily.     atorvastatin 20 MG tablet  Commonly known as:  LIPITOR  TAKE 1 TABLET DAILY (DISCONTINUE ALL PREVIOUS REFILLS FOR THIS MEDICATION)     canagliflozin 300 MG Tabs tablet  Commonly known as:  INVOKANA  Take 300 mg by mouth daily before breakfast.     cyclobenzaprine 10 MG  tablet  Commonly known as:  FLEXERIL  Take 1 tablet (10 mg total) by mouth 2 (two) times daily as needed. For back pain     FISH OIL PO  Take 500 mg by mouth 3 (three) times daily.     fluticasone 50 MCG/ACT nasal spray  Commonly known as:  FLONASE  USE 2 SPRAYS IN EACH NOSTRIL DAILY     glucose blood test strip  Commonly known as:  BAYER CONTOUR NEXT TEST  Use as instructed to check blood sugar 4-6 times per day dx code E11.9     HUMALOG 100 UNIT/ML injection  Generic drug:  insulin lispro  INJECT VIA INSULIN PUMP PER SLIDING SCALE     HYDROcodone-acetaminophen 10-325 MG tablet  Commonly known as:  NORCO  Take 1 tablet by mouth every 6 (six) hours as needed.     losartan 25 MG tablet  Commonly known as:  COZAAR  Take 25 mg by mouth daily.     metFORMIN 1000 MG tablet  Commonly known as:  GLUCOPHAGE  TAKE 2 TABLETS DAILY     metoprolol succinate 50 MG 24 hr tablet  Commonly known as:  TOPROL XL  Take 1 tablet (50 mg total) by mouth daily.     VITAMIN D-3 PO  Take 1,000 Int'l Units by mouth daily.        Allergies: No Known Allergies  Past Medical History  Diagnosis Date  . Diabetes mellitus   . Undiagnosed cardiac murmurs 01/25/2011  . SLEEP APNEA 01/25/2011  . Morbid obesity ([place]) 01/25/2011  . Mixed hyperlipidemia 01/25/2011  . DM 01/25/2011  . DIVERTICULOSIS OF COLON 01/25/2011  . COLONIC POLYPS, BENIGN 01/25/2011  . BACK STRAIN, LUMBAR 01/25/2011  . ALLERGIC RHINITIS, SEASONAL 01/25/2011  . Hypocalcemia 03/04/2012  . Preventative health care 03/04/2012  . Chronic radicular low back pain 01/25/2011    Qualifier: Diagnosis of  By: Charlett Blake MD, Stacey  1. Focal annular tear of the L4-5 disc at the level of the right  neural foramen with a tiny disc protrusion compressing the right L4  nerve in the neural foramen. This is more prominent than on the  prior study.  2. 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  Follows with Dr Genevie Ann of  [facility]  and [place]  . LAE (left atrial enlargement) 06/27/2013  . RBBB 01/25/2011    Qualifier: Diagnosis of  By: Charlett Blake MD, Erline Levine    . Personal history of colonic polyps- sessile serrated adenoma 12/21/2006  . Cough 11/10/2013  . Pain in right axilla 11/10/2013  . Tinea pedis 11/15/2014  . 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 Degeneration of lumbar or lumbosacral intervertebral disc   . Chronic pain syndrome     Past Surgical History  Procedure Laterality Date  . Tonsillectomy    . Cholecystectomy    . Skin biopsy on abdomen      unknown results    Family History  Problem Relation Age of Onset  . Cancer Mother     metastatic breast ca  . Allergies Mother   . Diabetes Father   . Other Father     renal failure/ CHF  . Coronary artery disease Father     s/p MI  . Diabetes Sister     Type 1  . Coronary artery disease Sister     s/p stents  . Heart disease Sister   . Obesity Brother   . Other Brother     Sport and exercise psychologist  . Asthma Daughter   . Other Daughter     back disease  . Diabetes Daughter   . Coronary artery disease Maternal Grandmother   . Diabetes Brother   . Heart disease Brother     s/p CABG  . Diabetes Brother     diet and oral meds  . Hypertension Brother   . Kidney disease Daughter     in childhood  . Colon cancer Neg Hx   . Pancreatic cancer Neg Hx   . Stomach cancer Neg Hx     Social History:  reports that he quit smoking about 25 years ago. He has never used smokeless tobacco. He reports that he drinks alcohol. He reports that he does not use illicit drugs.    Review of Systems    Most recent eye exam was in 3/16, has background retinopathy        Lipids: Under control with Lipitor 20 mg triglycerides are upper normal and HDL is improved Also followed by PCP      Lab Results  Component Value Date   CHOL 135 11/02/2015   HDL 43.30 11/02/2015   LDLCALC 62 11/02/2015   TRIG 149.0 11/02/2015   CHOLHDL  3 11/02/2015          The blood pressure has been controlled with taking low dose losartan and metoprolol, likely benefiting from [place]:  Lab on 12/29/2015  Component Date Value Ref Range Status  . Hgb A1c MFr Bld 12/29/2015 5.7  4.6 - 6.5 % Final   Glycemic Control Guidelines for People with Diabetes:Non Diabetic:  <6%Goal of Therapy: <7%Additional Action Suggested:  >8%   . Sodium 12/29/2015 141  135 - 145 mEq/L Final  . Potassium 12/29/2015 4.0  3.5 - 5.1 mEq/L Final  . Chloride 12/29/2015 106  96 - 112 mEq/L Final  . CO2 12/29/2015 27  19 - 32 mEq/L Final  . Glucose, Bld 12/29/2015 101* 70 - 99 mg/dL Final  . BUN 12/29/2015 15  6 - 23 mg/dL Final  . Creatinine, Ser 12/29/2015 0.87  0.40 - 1.50 mg/dL Final  . Total Bilirubin 12/29/2015 1.1  0.2 - 1.2 mg/dL Final  . Alkaline Phosphatase 12/29/2015 45  39 - 117 U/L  Final  . AST 12/29/2015 27  0 - 37 U/L Final  . ALT 12/29/2015 22  0 - 53 U/L Final  . Total Protein 12/29/2015 7.0  6.0 - 8.3 g/dL Final  . Albumin 12/29/2015 4.2  3.5 - 5.2 g/dL Final  . Calcium 12/29/2015 9.2  8.4 - 10.5 mg/dL Final  . GFR 12/29/2015 94.67  >60.00 mL/min Final  . Microalb, Ur 12/29/2015 1.4  0.0 - 1.9 mg/dL Final  . Creatinine,U 12/29/2015 86.9   Final  . Microalb Creat Ratio 12/29/2015 1.6  0.0 - 30.0 mg/g Final    Physical Examination:  BP 136/84 mmHg  Pulse 72  Temp(Src) 98.5 F (36.9 C)  Resp 16  Ht 6' (1.829 m)  Wt 312 lb 9.6 oz (141.794 kg)  BMI 42.39 kg/m2  SpO2 97%      ASSESSMENT:  Diabetes type 2, uncontrolled with  obesity and insulin resistant See history of present illness for detailed discussion of his current management, blood sugar patterns and problems identified     A1c is again upper normal at 5.7 No excess of hypoglycemia also He has also weight since his last visit and overall is benefiting from continuing [place] which has helped his control level also Overall he thinks he is doing better on  his diet and moderating calories and carbohydrates to help his weight However he still needing relatively large amount of carbohydrate coverage, usually 1:2 Discussed that he seems to have lower postprandial readings after supper and does not need as much carbohydrate coverage Blood sugars are the lowest at bedtime  Diabetic complications: Minimal  Hyperlipidemia: Well controlled   PLAN:   Reduce basal rate from 6 PM until 10 PM by 0.5  Try to use 1: 3 carbohydrate coverage at dinnertime  Occasionally check blood sugars during the night to help adjust basal rate  Encouraged him to walk regularly for exercise  More blood sugars 2 hours postprandially  May not need a bedtime snack if blood sugars are over 120  May use the pump for boluses if his insulin requirement decreases  Counseling time on subjects discussed above is over 50% of today's 25 minute visit   Jake Ortiz 01/03/2016, 9:29 AM   Note: This office note was prepared with Estate agent. Any transcriptional errors that result from this process are unintentional.

## 2016-01-16 ENCOUNTER — Other Ambulatory Visit: Payer: Self-pay | Admitting: Endocrinology

## 2016-01-17 DIAGNOSIS — M792 Neuralgia and neuritis, unspecified: Secondary | ICD-10-CM | POA: Diagnosis not present

## 2016-01-17 DIAGNOSIS — M5137 Other intervertebral disc degeneration, lumbosacral region: Secondary | ICD-10-CM | POA: Diagnosis not present

## 2016-01-17 DIAGNOSIS — G894 Chronic pain syndrome: Secondary | ICD-10-CM | POA: Diagnosis not present

## 2016-01-17 DIAGNOSIS — M5117 Intervertebral disc disorders with radiculopathy, lumbosacral region: Secondary | ICD-10-CM | POA: Diagnosis not present

## 2016-01-17 DIAGNOSIS — M545 Low back pain: Secondary | ICD-10-CM | POA: Diagnosis not present

## 2016-01-23 ENCOUNTER — Other Ambulatory Visit: Payer: Self-pay | Admitting: Endocrinology

## 2016-02-15 ENCOUNTER — Encounter: Payer: Self-pay | Admitting: Family Medicine

## 2016-02-18 ENCOUNTER — Other Ambulatory Visit: Payer: Self-pay | Admitting: *Deleted

## 2016-02-18 MED ORDER — GLUCOSE BLOOD VI STRP: ORAL_STRIP | Status: DC

## 2016-02-26 ENCOUNTER — Other Ambulatory Visit: Payer: Self-pay | Admitting: Family Medicine

## 2016-03-20 ENCOUNTER — Other Ambulatory Visit: Payer: Self-pay | Admitting: Endocrinology

## 2016-03-30 ENCOUNTER — Other Ambulatory Visit: Payer: Self-pay | Admitting: Family Medicine

## 2016-04-13 ENCOUNTER — Ambulatory Visit: Payer: Medicare Other | Admitting: Pulmonary Disease

## 2016-04-14 ENCOUNTER — Ambulatory Visit: Payer: Medicare Other | Admitting: Pulmonary Disease

## 2016-04-25 DIAGNOSIS — E113291 Type 2 diabetes mellitus with mild nonproliferative diabetic retinopathy without macular edema, right eye: Secondary | ICD-10-CM | POA: Diagnosis not present

## 2016-04-25 LAB — HM DIABETES EYE EXAM

## 2016-04-27 ENCOUNTER — Ambulatory Visit (INDEPENDENT_AMBULATORY_CARE_PROVIDER_SITE_OTHER): Payer: Medicare Other | Admitting: Pulmonary Disease

## 2016-04-27 ENCOUNTER — Encounter: Payer: Self-pay | Admitting: Pulmonary Disease

## 2016-04-27 VITALS — BP 137/74 | HR 66 | Ht 72.0 in | Wt 306.0 lb

## 2016-04-27 DIAGNOSIS — G4733 Obstructive sleep apnea (adult) (pediatric): Secondary | ICD-10-CM | POA: Diagnosis not present

## 2016-04-27 NOTE — Progress Notes (Signed)
   Subjective:    Patient ID: Jake Ortiz, male    DOB: October 23, 1956, 60 y.o.   MRN: [id number]  HPI  60 year old obese diabetic presents for FU of obstructive sleep apnea  PSG in 2006 [facility] [place]) showed AHI 25/h , desatn to 85% corrected by CPAP 12 cm with med FF mask    Was unable to use CPAP initially due to sinus issues, did not tolerate nasal mask or FF - discomfort. Now maintained on autoCPAP  He has tried zyrtec, nasonex & netti pot for sinuses   1 mnth Download 01/2014 - avg pr 8 cm, good compliance, AHi 3/h  04/27/2016  Chief Complaint  Patient presents with  . Follow-up    doing well on CPAP. needs supplies updated for the year.  no concerns.   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 Pleasant, obese, in no distress ENT - no lesions, no post nasal drip Neck: No JVD, no thyromegaly, no carotid bruits Lungs: no use of accessory muscles, no dullness to percussion, decreased without rales or rhonchi  Cardiovascular: Rhythm regular, heart sounds  normal, no murmurs or gallops, no peripheral edema Musculoskeletal: No deformities, no cyanosis or clubbing , no tremors       Assessment & Plan:

## 2016-04-27 NOTE — Patient Instructions (Signed)
Obtain download from your CPAP machine and we will provide feedback

## 2016-04-27 NOTE — Assessment & Plan Note (Signed)
Obtain download from your CPAP machine and we will provide feedback  Weight loss encouraged, compliance with goal of at least 4-6 hrs every night is the expectation. Advised against medications with sedative side effects Cautioned against driving when sleepy - understanding that sleepiness will vary on a day to day basis

## 2016-04-27 NOTE — Assessment & Plan Note (Signed)
Weight loss encouraged 

## 2016-04-28 ENCOUNTER — Ambulatory Visit (HOSPITAL_BASED_OUTPATIENT_CLINIC_OR_DEPARTMENT_OTHER)
Admission: RE | Admit: 2016-04-28 | Discharge: 2016-04-28 | Disposition: A | Discharge status: Home / Self Care | Payer: Medicare Other | Source: Ambulatory Visit | Attending: Family Medicine | Admitting: Family Medicine

## 2016-04-28 ENCOUNTER — Encounter: Payer: Self-pay | Admitting: Family Medicine

## 2016-04-28 ENCOUNTER — Ambulatory Visit (INDEPENDENT_AMBULATORY_CARE_PROVIDER_SITE_OTHER): Payer: Medicare Other | Admitting: Family Medicine

## 2016-04-28 VITALS — BP 122/72 | HR 73 | Temp 98.7°F | Ht 72.0 in | Wt 307.0 lb

## 2016-04-28 DIAGNOSIS — E669 Obesity, unspecified: Secondary | ICD-10-CM

## 2016-04-28 DIAGNOSIS — M546 Pain in thoracic spine: Secondary | ICD-10-CM

## 2016-04-28 DIAGNOSIS — N281 Cyst of kidney, acquired: Secondary | ICD-10-CM

## 2016-04-28 DIAGNOSIS — M549 Dorsalgia, unspecified: Secondary | ICD-10-CM | POA: Diagnosis present

## 2016-04-28 DIAGNOSIS — M50322 Other cervical disc degeneration at C5-C6 level: Secondary | ICD-10-CM | POA: Diagnosis not present

## 2016-04-28 DIAGNOSIS — M5416 Radiculopathy, lumbar region: Secondary | ICD-10-CM

## 2016-04-28 DIAGNOSIS — R1011 Right upper quadrant pain: Secondary | ICD-10-CM

## 2016-04-28 DIAGNOSIS — I10 Essential (primary) hypertension: Secondary | ICD-10-CM

## 2016-04-28 DIAGNOSIS — M47814 Spondylosis without myelopathy or radiculopathy, thoracic region: Secondary | ICD-10-CM | POA: Diagnosis not present

## 2016-04-28 DIAGNOSIS — I471 Supraventricular tachycardia, unspecified: Secondary | ICD-10-CM

## 2016-04-28 DIAGNOSIS — B353 Tinea pedis: Secondary | ICD-10-CM

## 2016-04-28 DIAGNOSIS — E119 Type 2 diabetes mellitus without complications: Secondary | ICD-10-CM | POA: Diagnosis not present

## 2016-04-28 DIAGNOSIS — E1169 Type 2 diabetes mellitus with other specified complication: Secondary | ICD-10-CM

## 2016-04-28 DIAGNOSIS — G8929 Other chronic pain: Secondary | ICD-10-CM

## 2016-04-28 DIAGNOSIS — Q6100 Congenital renal cyst, unspecified: Secondary | ICD-10-CM

## 2016-04-28 DIAGNOSIS — M5410 Radiculopathy, site unspecified: Secondary | ICD-10-CM

## 2016-04-28 DIAGNOSIS — E782 Mixed hyperlipidemia: Secondary | ICD-10-CM | POA: Diagnosis not present

## 2016-04-28 LAB — CBC WITH DIFFERENTIAL/PLATELET
Basophils Absolute: 0 10*3/uL (ref 0.0–0.1)
Basophils Relative: 0.3 % (ref 0.0–3.0)
Eosinophils Absolute: 0.3 10*3/uL (ref 0.0–0.7)
Eosinophils Relative: 4.9 % (ref 0.0–5.0)
HEMATOCRIT: 42.3 % (ref 39.0–52.0)
Hemoglobin: 13.1 g/dL (ref 13.0–17.0)
LYMPHS ABS: 1.8 10*3/uL (ref 0.7–4.0)
Lymphocytes Relative: 29.3 % (ref 12.0–46.0)
MCHC: 31 g/dL (ref 30.0–36.0)
Monocytes Absolute: 0.5 10*3/uL (ref 0.1–1.0)
Monocytes Relative: 7.7 % (ref 3.0–12.0)
NEUTROS ABS: 3.6 10*3/uL (ref 1.4–7.7)
NEUTROS PCT: 57.8 % (ref 43.0–77.0)
Platelets: 134 10*3/uL — ABNORMAL LOW (ref 150.0–400.0)
RBC: 6.2 Mil/uL — AB (ref 4.22–5.81)
RDW: 18.5 % — ABNORMAL HIGH (ref 11.5–15.5)
WBC: 6.2 10*3/uL (ref 4.0–10.5)

## 2016-04-28 LAB — LIPID PANEL
Cholesterol: 140 mg/dL (ref 0–200)
HDL: 36.9 mg/dL — AB (ref >39.00)
NONHDL: 103.24
Total CHOL/HDL Ratio: 4
Triglycerides: 236 mg/dL — ABNORMAL HIGH (ref 0.0–149.0)
VLDL: 47.2 mg/dL — AB (ref 0.0–40.0)

## 2016-04-28 LAB — HEPATIC FUNCTION PANEL
ALT: 19 U/L (ref 0–53)
AST: 25 U/L (ref 0–37)
Albumin: 4.2 g/dL (ref 3.5–5.2)
Alkaline Phosphatase: 47 U/L (ref 39–117)
BILIRUBIN DIRECT: 0.2 mg/dL (ref 0.0–0.3)
BILIRUBIN TOTAL: 1 mg/dL (ref 0.2–1.2)
Total Protein: 7.1 g/dL (ref 6.0–8.3)

## 2016-04-28 LAB — TSH: TSH: 0.97 u[IU]/mL (ref 0.35–4.50)

## 2016-04-28 LAB — LDL CHOLESTEROL, DIRECT: LDL DIRECT: 71 mg/dL

## 2016-04-28 NOTE — Patient Instructions (Signed)

## 2016-04-28 NOTE — Assessment & Plan Note (Addendum)
Have discussed taking over pain management. Encouraged moist heat and gentle stretching as tolerated. May try NSAIDs and prescription meds as directed and report if symptoms worsen or seek immediate care

## 2016-04-28 NOTE — Progress Notes (Signed)
Pre visit review using our clinic review tool, if applicable. No additional management support is needed unless otherwise documented below in the visit note. 

## 2016-05-01 ENCOUNTER — Other Ambulatory Visit: Payer: Self-pay | Admitting: Family Medicine

## 2016-05-01 ENCOUNTER — Other Ambulatory Visit (INDEPENDENT_AMBULATORY_CARE_PROVIDER_SITE_OTHER): Payer: Medicare Other

## 2016-05-01 DIAGNOSIS — E119 Type 2 diabetes mellitus without complications: Secondary | ICD-10-CM

## 2016-05-01 LAB — BASIC METABOLIC PANEL
BUN: 11 mg/dL (ref 6–23)
CALCIUM: 9 mg/dL (ref 8.4–10.5)
CO2: 27 meq/L (ref 19–32)
CREATININE: 0.85 mg/dL (ref 0.40–1.50)
Chloride: 108 mEq/L (ref 96–112)
GFR: 97.14 mL/min (ref >60.00)
GLUCOSE: 94 mg/dL (ref 70–99)
Potassium: 4 mEq/L (ref 3.5–5.1)
SODIUM: 142 meq/L (ref 135–145)

## 2016-05-01 LAB — HEMOGLOBIN A1C: HEMOGLOBIN A1C: 5.8 % (ref 4.6–6.5)

## 2016-05-01 MED ORDER — ATORVASTATIN CALCIUM 40 MG PO TABS: 40.0000 mg | ORAL_TABLET | Freq: Every day | ORAL | Status: DC

## 2016-05-02 ENCOUNTER — Ambulatory Visit: Payer: Medicare Other | Admitting: Family Medicine

## 2016-05-03 ENCOUNTER — Ambulatory Visit (HOSPITAL_BASED_OUTPATIENT_CLINIC_OR_DEPARTMENT_OTHER)
Admission: RE | Admit: 2016-05-03 | Discharge: 2016-05-03 | Disposition: A | Discharge status: Home / Self Care | Payer: Medicare Other | Source: Ambulatory Visit | Attending: Family Medicine | Admitting: Family Medicine

## 2016-05-03 DIAGNOSIS — B353 Tinea pedis: Secondary | ICD-10-CM

## 2016-05-03 DIAGNOSIS — E669 Obesity, unspecified: Secondary | ICD-10-CM | POA: Diagnosis not present

## 2016-05-03 DIAGNOSIS — R1011 Right upper quadrant pain: Secondary | ICD-10-CM | POA: Insufficient documentation

## 2016-05-03 DIAGNOSIS — I471 Supraventricular tachycardia: Secondary | ICD-10-CM

## 2016-05-03 DIAGNOSIS — M546 Pain in thoracic spine: Secondary | ICD-10-CM | POA: Diagnosis not present

## 2016-05-03 DIAGNOSIS — E782 Mixed hyperlipidemia: Secondary | ICD-10-CM | POA: Diagnosis not present

## 2016-05-03 DIAGNOSIS — E119 Type 2 diabetes mellitus without complications: Secondary | ICD-10-CM | POA: Insufficient documentation

## 2016-05-03 DIAGNOSIS — Z9049 Acquired absence of other specified parts of digestive tract: Secondary | ICD-10-CM | POA: Insufficient documentation

## 2016-05-03 DIAGNOSIS — N281 Cyst of kidney, acquired: Secondary | ICD-10-CM | POA: Diagnosis not present

## 2016-05-03 DIAGNOSIS — E1169 Type 2 diabetes mellitus with other specified complication: Secondary | ICD-10-CM

## 2016-05-03 DIAGNOSIS — I10 Essential (primary) hypertension: Secondary | ICD-10-CM

## 2016-05-04 ENCOUNTER — Encounter: Payer: Self-pay | Admitting: Endocrinology

## 2016-05-04 ENCOUNTER — Other Ambulatory Visit: Payer: Self-pay | Admitting: *Deleted

## 2016-05-04 ENCOUNTER — Other Ambulatory Visit: Payer: Self-pay

## 2016-05-04 ENCOUNTER — Ambulatory Visit (INDEPENDENT_AMBULATORY_CARE_PROVIDER_SITE_OTHER): Payer: Medicare Other | Admitting: Endocrinology

## 2016-05-04 VITALS — BP 143/82 | HR 75 | Ht 73.0 in | Wt 305.0 lb

## 2016-05-04 DIAGNOSIS — E785 Hyperlipidemia, unspecified: Secondary | ICD-10-CM | POA: Diagnosis not present

## 2016-05-04 DIAGNOSIS — E119 Type 2 diabetes mellitus without complications: Secondary | ICD-10-CM | POA: Diagnosis not present

## 2016-05-04 DIAGNOSIS — G4733 Obstructive sleep apnea (adult) (pediatric): Secondary | ICD-10-CM

## 2016-05-04 MED ORDER — GLUCOSE BLOOD VI STRP: ORAL_STRIP | Status: DC

## 2016-05-04 MED ORDER — GLUCOSE BLOOD VI STRP
ORAL_STRIP | Status: DC
Start: 2016-05-04 — End: 2017-05-11

## 2016-05-04 NOTE — Progress Notes (Signed)
Patient ID: Jake Ortiz, male   DOB: Oct 05, 1956, 60 y.o.   MRN: [id number]           Reason for Appointment: Follow-up for Type 2 Diabetes    History of Present Illness:          Diagnosis: Type 2 diabetes mellitus, date of diagnosis: 1996       Past history:  He was initially treated with metformin at diagnosis in about 5 years later he was started on insulin He had been on Levemir and possibly other insulin regimens before going on an insulin pump in 2005 Apparently was put on insulin pump because of his large insulin requirement and poor control Also has been continued on metformin He  had fairly good A1c results in the past year although previously ranging from 7-7.9; in 07/2014 Was 6.1  Recent history:   He is on a Medtronic 723 insulin pump using a regimen of basal only with the pump and bolusing with Humalog using a syringe.  BASAL RATES: Midnight = 6.0, 4 AM = 6.5, 9 AM = 4, 1 PM = 4.5 and 10 PM = 5.25, total basal rate about 120 units per day BOLUSES: Carbohydrate coverage 1:1 with Humalog with a syringe, correction 1:10 with target 90-110  He has been on [facility]  since 3/16 which has helped his control , reduce insulin requirement and enable weight loss His blood sugars have been consistently well controlled with A1c upper normal again  Current blood sugar patterns and management:  His fasting blood sugars are generally near normal without any overnight hypoglycemia  Again checking blood sugars mostly right before eating and some readings after evening meal  LOWEST blood sugars are before lunch with only occasionally over 100  Also blood sugars AFTER supper are appearing to be below 100 periodically with the lowest reading 57  and not many after meals  Blood sugars are consistent throughout the day mild fluctuation only  Blood sugars tend to be relatively lower at bedtime or postprandially after supper  BASAL rate is still relatively high during the  night compared to the daytime  He does know when he feels hypoglycemic which is usually when blood sugars are below 75  Although he was told to try 1: 2 carbohydrate ratio for his meals he is still using 1:1 ratio  He thinks he is trying to watch his diet overall and eat healthier but is also traveling and occasionally will go off the diet with eating pizza and other higher carbohydrate meals  He is trying to be active with yard work and some walking  He is concerned about possible side effects from [place] and is asking to stop this   Hypoglycemia: Minimal as above      Oral hypoglycemic drugs the patient is taking are: Metformin 1 g twice a day, Invokana 300 mg daily      Compliance with the medical regimen:  Generally good   Glucose monitoring:  done 4 times a day         Glucometer: Contour    Blood Glucose readings by time of day and averages from pump download:  Mean values apply above for all meters except median for One Touch  PRE-MEAL Fasting Lunch Dinner Bedtime Overall  Glucose range: 75-159  69-115  86-168  57-127    Mean/median: 118  95  122  102  110+/-26     Self-care: The diet that the patient has been following is: tries  to limit high-fat meals   Meals: 3 meals per day. Breakfast is eggs and toast, has relatively more carbohydrates at lunch            Exercise:  trying to walk some periodically, Yard work [employer] visit, most recent: None recently .               Weight history:  Wt Readings from Last 3 Encounters:  05/04/16 305 lb (138.347 kg)  04/28/16 307 lb (139.254 kg)  04/27/16 306 lb (138.801 kg)    Glycemic control:     Lab Results  Component Value Date   HGBA1C 5.8 05/01/2016   HGBA1C 5.7 12/29/2015   HGBA1C 5.4 08/10/2015   Lab Results  Component Value Date   MICROALBUR 1.4 12/29/2015   LDLCALC 62 11/02/2015   CREATININE 0.85 05/01/2016         Medication List       This list is accurate as of: 05/04/16 12:45 PM.  Always use  your most recent med list.               aspirin EC 81 MG tablet  Take 1 tablet (81 mg total) by mouth daily.     atorvastatin 40 MG tablet  Commonly known as:  LIPITOR  Take 1 tablet (40 mg total) by mouth daily.     cyclobenzaprine 10 MG tablet  Commonly known as:  FLEXERIL  Take 1 tablet (10 mg total) by mouth 2 (two) times daily as needed. For back pain     FISH OIL PO  Take 500 mg by mouth 3 (three) times daily.     fluticasone 50 MCG/ACT nasal spray  Commonly known as:  FLONASE  USE 2 SPRAYS IN EACH NOSTRIL DAILY     glucose blood test strip  Commonly known as:  BAYER CONTOUR NEXT TEST  USE AS DIRECTED 4-6 TIMES A DAY TO CHECK BLOOD SUGAR     glucose blood test strip  Commonly known as:  BAYER CONTOUR NEXT TEST  USE AS DIRECTED 4-6 TIMES A DAY TO CHECK BLOOD SUGAR     glucose blood test strip  Commonly known as:  BAYER CONTOUR NEXT TEST  USE AS DIRECTED 4-6 TIMES A DAY TO CHECK BLOOD SUGAR     glucose blood test strip  Commonly known as:  BAYER CONTOUR NEXT TEST  USE AS DIRECTED 4-6 TIMES A DAY TO CHECK BLOOD SUGAR     HUMALOG 100 UNIT/ML injection  Generic drug:  insulin lispro  INJECT VIA INSULIN PUMP PER SLIDING SCALE     HYDROcodone-acetaminophen 10-325 MG tablet  Commonly known as:  NORCO  Take 1 tablet by mouth every 6 (six) hours as needed.     INVOKANA 300 MG Tabs tablet  Generic drug:  canagliflozin  TAKE 1 TABLET DAILY BEFORE BREAKFAST     losartan 25 MG tablet  Commonly known as:  COZAAR  TAKE 1 TABLET DAILY     metFORMIN 1000 MG tablet  Commonly known as:  GLUCOPHAGE  TAKE 2 TABLETS DAILY     metoprolol succinate 50 MG 24 hr tablet  Commonly known as:  TOPROL XL  Take 1 tablet (50 mg total) by mouth daily.     VITAMIN D-3 PO  Take 1,000 Int'l Units by mouth daily.        Allergies: No Known Allergies  Past Medical History  Diagnosis Date  . Diabetes mellitus   . Undiagnosed cardiac murmurs 01/25/2011  . SLEEP  APNEA 01/25/2011    . Morbid obesity ([place]) 01/25/2011  . Mixed hyperlipidemia 01/25/2011  . DM 01/25/2011  . DIVERTICULOSIS OF COLON 01/25/2011  . COLONIC POLYPS, BENIGN 01/25/2011  . BACK STRAIN, LUMBAR 01/25/2011  . ALLERGIC RHINITIS, SEASONAL 01/25/2011  . Hypocalcemia 03/04/2012  . Preventative health care 03/04/2012  . Chronic radicular low back pain 01/25/2011    Qualifier: Diagnosis of  By: Charlett Blake MD, Stacey  1. Focal annular tear of the L4-5 disc at the level of the right  neural foramen with a tiny disc protrusion compressing the right L4  nerve in the neural foramen. This is more prominent than on the  prior study.  2. 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  Follows with Dr Genevie Ann [facility]  . LAE (left atrial enlargement) 06/27/2013  . RBBB 01/25/2011    Qualifier: Diagnosis of  By: Charlett Blake MD, Erline Levine    . Personal history of colonic polyps- sessile serrated adenoma 12/21/2006  . Cough 11/10/2013  . Pain in right axilla 11/10/2013  . Tinea pedis 11/15/2014  . 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 Degeneration of lumbar or lumbosacral intervertebral disc   . Chronic pain syndrome     Past Surgical History  Procedure Laterality Date  . Tonsillectomy    . Cholecystectomy    . Skin biopsy on abdomen      unknown results    Family History  Problem Relation Age of Onset  . Cancer Mother     metastatic breast ca  . Allergies Mother   . Diabetes Father   . Other Father     renal failure/ CHF  . Coronary artery disease Father     s/p MI  . Diabetes Sister     Type 1  . Coronary artery disease Sister     s/p stents  . Heart disease Sister   . Obesity Brother   . Other Brother     Sport and exercise psychologist  . Asthma Daughter   . Other Daughter     back disease  . Diabetes Daughter   . Coronary artery disease Maternal Grandmother   . Diabetes Brother   . Heart disease Brother     s/p CABG  . Diabetes Brother      diet and oral meds  . Hypertension Brother   . Kidney disease Daughter     in childhood  . Colon cancer Neg Hx   . Pancreatic cancer Neg Hx   . Stomach cancer Neg Hx     Social History:  reports that he quit smoking about 25 years ago. He has never used smokeless tobacco. He reports that he drinks alcohol. He reports that he does not use illicit drugs.    Review of Systems    Most recent eye exam was in 3/16, has background retinopathy        Lipids: Under control with Lipitor.  Has history of high triglycerides previously His PCP increased the dose from 20 mg to 40 mg because of higher triglycerides  However his triglycerides were nonfasting and LDL has been consistently around 70       Lab Results  Component Value Date   CHOL 140 04/28/2016   HDL 36.90* 04/28/2016   LDLCALC 62 11/02/2015   LDLDIRECT 71.0 04/28/2016   TRIG 236.0* 04/28/2016   CHOLHDL 4 04/28/2016          The blood pressure  has been controlled with taking low dose losartan and metoprolol, likely benefiting from Invokana      LABS:  Lab on 05/01/2016  Component Date Value Ref Range Status  . Hgb A1c MFr Bld 05/01/2016 5.8  4.6 - 6.5 % Final   Glycemic Control Guidelines for People with Diabetes:Non Diabetic:  <6%Goal of Therapy: <7%Additional Action Suggested:  >8%   . Sodium 05/01/2016 142  135 - 145 mEq/L Final  . Potassium 05/01/2016 4.0  3.5 - 5.1 mEq/L Final  . Chloride 05/01/2016 108  96 - 112 mEq/L Final  . CO2 05/01/2016 27  19 - 32 mEq/L Final  . Glucose, Bld 05/01/2016 94  70 - 99 mg/dL Final  . BUN 05/01/2016 11  6 - 23 mg/dL Final  . Creatinine, Ser 05/01/2016 0.85  0.40 - 1.50 mg/dL Final  . Calcium 05/01/2016 9.0  8.4 - 10.5 mg/dL Final  . GFR 05/01/2016 97.14  >60.00 mL/min Final  Office Visit on 04/28/2016  Component Date Value Ref Range Status  . WBC 04/28/2016 6.2  4.0 - 10.5 K/uL Final  . RBC 04/28/2016 6.20* 4.22 - 5.81 Mil/uL Final  . Hemoglobin 04/28/2016 13.1  13.0 - 17.0  g/dL Final  . HCT 04/28/2016 42.3  39.0 - 52.0 % Final  . MCV 04/28/2016 68.3 Repeated and verified X2.* 78.0 - 100.0 fl Final  . MCHC 04/28/2016 31.0  30.0 - 36.0 g/dL Final  . RDW 04/28/2016 18.5* 11.5 - 15.5 % Final  . Platelets 04/28/2016 134.0* 150.0 - 400.0 K/uL Final  . Neutrophils Relative % 04/28/2016 57.8  43.0 - 77.0 % Final  . Lymphocytes Relative 04/28/2016 29.3  12.0 - 46.0 % Final  . Monocytes Relative 04/28/2016 7.7  3.0 - 12.0 % Final  . Eosinophils Relative 04/28/2016 4.9  0.0 - 5.0 % Final  . Basophils Relative 04/28/2016 0.3  0.0 - 3.0 % Final  . Neutro Abs 04/28/2016 3.6  1.4 - 7.7 K/uL Final  . Lymphs Abs 04/28/2016 1.8  0.7 - 4.0 K/uL Final  . Monocytes Absolute 04/28/2016 0.5  0.1 - 1.0 K/uL Final  . Eosinophils Absolute 04/28/2016 0.3  0.0 - 0.7 K/uL Final  . Basophils Absolute 04/28/2016 0.0  0.0 - 0.1 K/uL Final  . Cholesterol 04/28/2016 140  0 - 200 mg/dL Final   ATP III Classification       Desirable:  < 200 mg/dL               Borderline High:  200 - 239 mg/dL          High:  > = 240 mg/dL  . Triglycerides 04/28/2016 236.0* 0.0 - 149.0 mg/dL Final   Normal:  <150 mg/dLBorderline High:  150 - 199 mg/dL  . HDL 04/28/2016 36.90* >39.00 mg/dL Final  . VLDL 04/28/2016 47.2* 0.0 - 40.0 mg/dL Final  . Total CHOL/HDL Ratio 04/28/2016 4   Final                  Men          Women1/2 Average Risk     3.4          3.3Average Risk          5.0          4.42X Average Risk          9.6          7.13X Average Risk          15.0  11.0                      . NonHDL 04/28/2016 103.24   Final   NOTE:  Non-HDL goal should be 30 mg/dL higher than patient's LDL goal (i.e. LDL goal of < 70 mg/dL, would have non-HDL goal of < 100 mg/dL)  . Total Bilirubin 04/28/2016 1.0  0.2 - 1.2 mg/dL Final  . Bilirubin, Direct 04/28/2016 0.2  0.0 - 0.3 mg/dL Final  . Alkaline Phosphatase 04/28/2016 47  39 - 117 U/L Final  . AST 04/28/2016 25  0 - 37 U/L Final  . ALT 04/28/2016 19  0 -  53 U/L Final  . Total Protein 04/28/2016 7.1  6.0 - 8.3 g/dL Final  . Albumin 04/28/2016 4.2  3.5 - 5.2 g/dL Final  . TSH 04/28/2016 0.97  0.35 - 4.50 uIU/mL Final  . Direct LDL 04/28/2016 71.0   Final   Optimal:  <100 mg/dLNear or Above Optimal:  100-129 mg/dLBorderline High:  130-159 mg/dLHigh:  160-189 mg/dLVery High:  >190 mg/dL    Physical Examination:  BP 143/82 mmHg  Pulse 75  Ht 6\' 1"  (1.854 m)  Wt 305 lb (138.347 kg)  BMI 40.25 kg/m2      ASSESSMENT:  Diabetes type 2, uncontrolled with  obesity and insulin resistant See history of present illness for detailed discussion of his current management, blood sugar patterns and problems identified     A1c is again upper normal at 5.8 He is checking his blood sugars mostly before meals with some readings after supper Overall blood sugars are fairly close to normal He is also overall able to keep his weight down with continuing Invokana as well as usually trying to be fairly good with diet and activity  He is concerned about possible kidney damage from [place] and high cholesterol However discussed that [place] does not cause kidney damage and studies now are showing reduced nephropathy in the long run as well as probable cardio vascular  benefits Also discussed that his LDL  has not been higher because of Invokana  HYPERLIPIDEMIA: His nonfasting triglycerides are higher and discussed that this is probably related to some fluctuation in his diet.  He is having excellent LDL levels Most likely increasing Lipitor will not improve his triglycerides but does need reassessment fasting   PLAN:  Since lowest blood sugars are before lunch will need to reduce his basal rate in the mornings: 9 AM = 3.5 until 2 PM  Discussed that since he has tendency to low normal readings after supper including a hypoglycemic event he will need to reduce his mealtime coverage by at least 5 units at supper Encouraged him to be consistent with  diet Regular walking program  Recommend checking an NMR lipoprotein panel, consider adding fenofibrate or niacin if he has significantly high particle number, discussed LDL particle abnormalities  in diabetes  Counseling time on subjects discussed above is over 50% of today's 25 minute visit   Jake Ortiz 05/04/2016, 12:45 PM   Note: This office note was prepared with Dragon voice recognition system technology. Any transcriptional errors that result from this process are unintentional.

## 2016-05-04 NOTE — Patient Instructions (Signed)
New basals  Reduce supper dose 5 units

## 2016-05-07 ENCOUNTER — Encounter: Payer: Self-pay | Admitting: Family Medicine

## 2016-05-07 DIAGNOSIS — R1011 Right upper quadrant pain: Secondary | ICD-10-CM | POA: Insufficient documentation

## 2016-05-07 DIAGNOSIS — N281 Cyst of kidney, acquired: Secondary | ICD-10-CM

## 2016-05-07 DIAGNOSIS — M549 Dorsalgia, unspecified: Secondary | ICD-10-CM | POA: Insufficient documentation

## 2016-05-07 HISTORY — DX: Cyst of kidney, acquired: N28.1

## 2016-05-07 NOTE — Assessment & Plan Note (Signed)
minimize simple carbs. Increase exercise as tolerated.  

## 2016-05-07 NOTE — Assessment & Plan Note (Signed)
Well controlled, no changes to meds. Encouraged heart healthy diet such as the DASH diet and exercise as tolerated.  °

## 2016-05-07 NOTE — Assessment & Plan Note (Signed)
Abdominal ultrasound only shows a left renal cyst

## 2016-05-07 NOTE — Assessment & Plan Note (Signed)
Tolerating statin, encouraged heart healthy diet, avoid trans fats, minimize simple carbs and saturated fats. Increase exercise as tolerated 

## 2016-05-07 NOTE — Assessment & Plan Note (Signed)
Encouraged DASH diet, decrease po intake and increase exercise as tolerated. Needs 7-8 hours of sleep nightly. Avoid trans fats, eat small, frequent meals every 4-5 hours with lean proteins, complex carbs and healthy fats. Minimize simple carbs 

## 2016-05-07 NOTE — Progress Notes (Signed)
Patient ID: Jake Ortiz, male   DOB: [birth year], 60 y.o.   MRN: [id number]   Subjective:    Patient ID: Jake Ortiz, male    DOB: 12-05-1955, 60 y.o.   MRN: [id number]  Chief Complaint  Patient presents with  . Flank Pain    HPI Patient is in today for follow up. Is noting some right upper quadrant pain intermittently. No recent illness but has had trouble with this pain off and on for years. No association with eating. Does change with position changes. Denies CP/palp/SOB/HA/congestion/fevers/GI or GU c/o. Taking meds as prescribed  Past Medical History  Diagnosis Date  . Diabetes mellitus   . Undiagnosed cardiac murmurs 01/25/2011  . SLEEP APNEA 01/25/2011  . Morbid obesity ([place]) 01/25/2011  . Mixed hyperlipidemia 01/25/2011  . DM 01/25/2011  . DIVERTICULOSIS OF COLON 01/25/2011  . COLONIC POLYPS, BENIGN 01/25/2011  . BACK STRAIN, LUMBAR 01/25/2011  . ALLERGIC RHINITIS, SEASONAL 01/25/2011  . Hypocalcemia 03/04/2012  . Preventative health care 03/04/2012  . Chronic radicular low back pain 01/25/2011    Qualifier: Diagnosis of  By: Charlett Blake MD, Samra Pesch  1. Focal annular tear of the L4-5 disc at the level of the right  neural foramen with a tiny disc protrusion compressing the right L4  nerve in the neural foramen. This is more prominent than on the  prior study.  2. 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  Follows with [facility] Genevie [facility]  . LAE (left atrial enlargement) 06/27/2013  . RBBB 01/25/2011    Qualifier: Diagnosis of  By: Charlett Blake MD, Erline Levine    . Personal history of colonic polyps- sessile serrated adenoma 12/21/2006  . Cough 11/10/2013  . Pain in right axilla 11/10/2013  . Tinea pedis 11/15/2014  . 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 Degeneration of lumbar or lumbosacral intervertebral disc   . Chronic pain syndrome   . Renal cyst 05/07/2016    Past Surgical History  Procedure  Laterality Date  . Tonsillectomy    . Cholecystectomy    . Skin biopsy on abdomen      unknown results    Family History  Problem Relation Age of Onset  . Cancer Mother     metastatic breast ca  . Allergies Mother   . Diabetes Father   . Other Father     renal failure/ CHF  . Coronary artery disease Father     s/p MI  . Diabetes Sister     Type 1  . Coronary artery disease Sister     s/p stents  . Heart disease Sister   . Obesity Brother   . Other Brother     Sport and exercise psychologist  . Asthma Daughter   . Other Daughter     back disease  . Diabetes Daughter   . Coronary artery disease Maternal Grandmother   . Diabetes Brother   . Heart disease Brother     s/p CABG  . Diabetes Brother     diet and oral meds  . Hypertension Brother   . Kidney disease Daughter     in childhood  . Colon cancer Neg Hx   . Pancreatic cancer Neg Hx   . Stomach cancer Neg Hx     Social History   Social History  . Marital Status: Married    Spouse Name: N/A  . Number of Children: 4  .  Years of Education: N/A   Occupational History  . retired    Social History Main Topics  . Smoking status: Former Smoker -- 3.00 packs/day for 10 years    Quit date: 11/20/1990  . Smokeless tobacco: Never Used  . Alcohol Use: Yes     Comment: occasional  . Drug Use: No  . Sexual Activity: Yes   Other Topics Concern  . Not on file   Social History Narrative    Outpatient Prescriptions Prior to Visit  Medication Sig Dispense Refill  . aspirin EC 81 MG tablet Take 1 tablet (81 mg total) by mouth daily.    . Cholecalciferol (VITAMIN D-3 PO) Take 1,000 Int'l Units by mouth daily.     . cyclobenzaprine (FLEXERIL) 10 MG tablet Take 1 tablet (10 mg total) by mouth 2 (two) times daily as needed. For back pain 30 tablet 2  . fluticasone (FLONASE) 50 MCG/ACT nasal spray USE 2 SPRAYS IN EACH NOSTRIL DAILY 48 g 6  . HUMALOG 100 UNIT/ML injection INJECT VIA INSULIN PUMP PER SLIDING SCALE 250 mL 1  .  HYDROcodone-acetaminophen (NORCO) 10-325 MG per tablet Take 1 tablet by mouth every 6 (six) hours as needed.     . INVOKANA 300 MG TABS tablet TAKE 1 TABLET DAILY BEFORE BREAKFAST 90 tablet 2  . losartan (COZAAR) 25 MG tablet TAKE 1 TABLET DAILY 90 tablet 0  . metFORMIN (GLUCOPHAGE) 1000 MG tablet TAKE 2 TABLETS DAILY 180 tablet 1  . metoprolol succinate (TOPROL-XL) 50 MG 24 hr tablet Take 1 tablet (50 mg total) by mouth daily. 90 tablet 3  . Omega-3 Fatty Acids (FISH OIL PO) Take 500 mg by mouth 3 (three) times daily.     Marland Kitchen atorvastatin (LIPITOR) 20 MG tablet TAKE 1 TABLET DAILY (DISCONTINUE ALL PREVIOUS REFILLS FOR THIS MEDICATION) 90 tablet 1  . glucose blood (BAYER CONTOUR NEXT TEST) test strip USE AS DIRECTED 4-6 TIMES A DAY TO CHECK BLOOD SUGAR 200 each 3   No facility-administered medications prior to visit.    No Known Allergies  Review of Systems  Constitutional: Negative for fever and malaise/fatigue.  HENT: Negative for congestion.   Eyes: Negative for blurred vision.  Respiratory: Negative for shortness of breath.   Cardiovascular: Negative for chest pain, palpitations and leg swelling.  Gastrointestinal: Positive for abdominal pain. Negative for nausea and blood in stool.  Genitourinary: Negative for dysuria and frequency.  Musculoskeletal: Positive for back pain. Negative for falls.  Skin: Negative for rash.  Neurological: Negative for dizziness, loss of consciousness and headaches.  Endo/Heme/Allergies: Negative for environmental allergies.  Psychiatric/Behavioral: Negative for depression. The patient is not nervous/anxious.        Objective:    Physical Exam  Constitutional: He is oriented to person, place, and time. He appears well-developed and well-nourished. No distress.  HENT:  Head: Normocephalic and atraumatic.  Nose: Nose normal.  Eyes: Right eye exhibits no discharge. Left eye exhibits no discharge.  Neck: Normal range of motion. Neck supple.    Cardiovascular: Normal rate and regular rhythm.   No murmur heard. Pulmonary/Chest: Effort normal and breath sounds normal.  Abdominal: Soft. Bowel sounds are normal. There is no tenderness.  Musculoskeletal: He exhibits no edema.  Neurological: He is alert and oriented to person, place, and time.  Skin: Skin is warm and dry.  Psychiatric: He has a normal mood and affect.  Nursing note and vitals reviewed.   BP 122/72 mmHg  Pulse 73  Temp(Src) 98.7 F (  37.1 C) (Oral)  Ht 6' (1.829 m)  Wt 307 lb (139.254 kg)  BMI 41.63 kg/m2  SpO2 96% Wt Readings from Last 3 Encounters:  05/04/16 305 lb (138.347 kg)  04/28/16 307 lb (139.254 kg)  04/27/16 306 lb (138.801 kg)     Lab Results  Component Value Date   WBC 6.2 04/28/2016   HGB 13.1 04/28/2016   HCT 42.3 04/28/2016   PLT 134.0* 04/28/2016   GLUCOSE 94 05/01/2016   CHOL 140 04/28/2016   TRIG 236.0* 04/28/2016   HDL 36.90* 04/28/2016   LDLDIRECT 71.0 04/28/2016   LDLCALC 62 11/02/2015   ALT 19 04/28/2016   AST 25 04/28/2016   NA 142 05/01/2016   K 4.0 05/01/2016   CL 108 05/01/2016   CREATININE 0.85 05/01/2016   BUN 11 05/01/2016   CO2 27 05/01/2016   TSH 0.97 04/28/2016   PSA 0.91 11/02/2015   HGBA1C 5.8 05/01/2016   MICROALBUR 1.4 12/29/2015    Lab Results  Component Value Date   TSH 0.97 04/28/2016   Lab Results  Component Value Date   WBC 6.2 04/28/2016   HGB 13.1 04/28/2016   HCT 42.3 04/28/2016   MCV 68.3 Repeated and verified X2.* 04/28/2016   PLT 134.0* 04/28/2016   Lab Results  Component Value Date   NA 142 05/01/2016   K 4.0 05/01/2016   CO2 27 05/01/2016   GLUCOSE 94 05/01/2016   BUN 11 05/01/2016   CREATININE 0.85 05/01/2016   BILITOT 1.0 04/28/2016   ALKPHOS 47 04/28/2016   AST 25 04/28/2016   ALT 19 04/28/2016   PROT 7.1 04/28/2016   ALBUMIN 4.2 04/28/2016   CALCIUM 9.0 05/01/2016   GFR 97.14 05/01/2016   Lab Results  Component Value Date   CHOL 140 04/28/2016   Lab Results   Component Value Date   HDL 36.90* 04/28/2016   Lab Results  Component Value Date   LDLCALC 62 11/02/2015   Lab Results  Component Value Date   TRIG 236.0* 04/28/2016   Lab Results  Component Value Date   CHOLHDL 4 04/28/2016   Lab Results  Component Value Date   HGBA1C 5.8 05/01/2016       Assessment & Plan:   Problem List Items Addressed This Visit    Tinea pedis   Relevant Orders   US Abdomen Complete (Completed)   DG Thoracic Spine 2 View (Completed)   CBC w/Diff (Completed)   Lipid panel (Completed)   Hepatic function panel (Completed)   TSH (Completed)   SVT (supraventricular tachycardia) (HCC)   Relevant Orders   US Abdomen Complete (Completed)   DG Thoracic Spine 2 View (Completed)   CBC w/Diff (Completed)   Lipid panel (Completed)   Hepatic function panel (Completed)   TSH (Completed)   RUQ pain - Primary    Abdominal ultrasound only shows a left renal cyst      Relevant Orders   US Abdomen Complete (Completed)   DG Thoracic Spine 2 View (Completed)   CBC w/Diff (Completed)   Lipid panel (Completed)   Hepatic function panel (Completed)   TSH (Completed)   Renal cyst    Large, simple cyst on left, patient declines referral to urology up finding. He will notify us if symptoms worsen      Notalgia   Relevant Orders   US Abdomen Complete (Completed)   DG Thoracic Spine 2 View (Completed)   CBC w/Diff (Completed)   Lipid panel (Completed)   Hepatic function panel (  Completed)   TSH (Completed)   Morbid obesity ([place])     Encouraged DASH diet, decrease po intake and increase exercise as tolerated. Needs 7-8 hours of sleep nightly. Avoid trans fats, eat small, frequent meals every 4-5 hours with lean proteins, complex carbs and healthy fats. Minimize simple carbs      Relevant Orders   US Abdomen Complete (Completed)   DG Thoracic Spine 2 View (Completed)   CBC w/Diff (Completed)   Lipid panel (Completed)   Hepatic function panel (Completed)    TSH (Completed)   Mixed hyperlipidemia    Tolerating statin, encouraged heart healthy diet, avoid trans fats, minimize simple carbs and saturated fats. Increase exercise as tolerated      Relevant Orders   US Abdomen Complete (Completed)   DG Thoracic Spine 2 View (Completed)   CBC w/Diff (Completed)   Lipid panel (Completed)   Hepatic function panel (Completed)   TSH (Completed)   HTN (hypertension)    Well controlled, no changes to meds. Encouraged heart healthy diet such as the DASH diet and exercise as tolerated.       Relevant Orders   US Abdomen Complete (Completed)   DG Thoracic Spine 2 View (Completed)   CBC w/Diff (Completed)   Lipid panel (Completed)   Hepatic function panel (Completed)   TSH (Completed)   Diabetes mellitus type 2 in obese (HCC) (Chronic)    minimize simple carbs. Increase exercise as tolerated.       Relevant Orders   US Abdomen Complete (Completed)   DG Thoracic Spine 2 View (Completed)   CBC w/Diff (Completed)   Lipid panel (Completed)   Hepatic function panel (Completed)   TSH (Completed)   Chronic radicular low back pain    Have discussed taking over pain management. Encouraged moist heat and gentle stretching as tolerated. May try NSAIDs and prescription meds as directed and report if symptoms worsen or seek immediate care         I am having [name] maintain his HYDROcodone-acetaminophen, Omega-3 Fatty Acids (FISH OIL PO), Cholecalciferol (VITAMIN D-3 PO), cyclobenzaprine, fluticasone, metoprolol succinate, aspirin EC, INVOKANA, metFORMIN, HUMALOG, and losartan.  No orders of the defined types were placed in this encounter.     Penni Homans, MD

## 2016-05-07 NOTE — Assessment & Plan Note (Signed)
Large, simple cyst on left, patient declines referral to urology up finding. He will notify us if symptoms worsen

## 2016-05-08 ENCOUNTER — Encounter: Payer: Self-pay | Admitting: Endocrinology

## 2016-05-09 ENCOUNTER — Ambulatory Visit: Payer: Medicare Other | Admitting: Family Medicine

## 2016-05-25 ENCOUNTER — Telehealth: Payer: Self-pay | Admitting: Endocrinology

## 2016-05-25 NOTE — Telephone Encounter (Signed)
Pump paper work resubmitted to the [employer].

## 2016-05-25 NOTE — Telephone Encounter (Signed)
[facility] medical sent a request for patient pump supply's.

## 2016-05-25 NOTE — Telephone Encounter (Signed)
Request placed on Md's desks.

## 2016-05-26 NOTE — Telephone Encounter (Signed)
Received call from team health asking me to sign physical form for pump supplies. Advised I cannot sign form as office is closed after 5pm and I am on call physician  Pt has enough supplies to last through next week. [facility] will call at start of business day on Monday to f/u with any concerns.

## 2016-06-29 ENCOUNTER — Other Ambulatory Visit: Payer: Self-pay | Admitting: Family Medicine

## 2016-06-29 MED ORDER — LOSARTAN POTASSIUM 25 MG PO TABS: 25.0000 mg | ORAL_TABLET | Freq: Every day | ORAL | 2 refills | Status: DC

## 2016-06-30 ENCOUNTER — Other Ambulatory Visit: Payer: Self-pay | Admitting: Cardiovascular Disease

## 2016-07-20 ENCOUNTER — Telehealth: Payer: Self-pay | Admitting: Family Medicine

## 2016-07-20 MED ORDER — LOSARTAN POTASSIUM 25 MG PO TABS: 25.0000 mg | ORAL_TABLET | Freq: Every day | ORAL | 0 refills | Status: DC

## 2016-07-20 NOTE — Telephone Encounter (Signed)
°  Relation to [id number] Call back [phone] Pharmacy: [facility] [address], [place] [address] [phone]    Reason for call:  Patient is out of the town and left medication home requesting a 30 day supply losartan (COZAAR) 25 MG tablet

## 2016-07-20 NOTE — Telephone Encounter (Signed)
Sent in to pharmacy and pt informed.

## 2016-08-28 ENCOUNTER — Other Ambulatory Visit: Payer: Self-pay | Admitting: Family Medicine

## 2016-08-30 ENCOUNTER — Encounter: Payer: Self-pay | Admitting: Cardiovascular Disease

## 2016-09-11 ENCOUNTER — Other Ambulatory Visit (INDEPENDENT_AMBULATORY_CARE_PROVIDER_SITE_OTHER): Payer: Medicare Other

## 2016-09-11 ENCOUNTER — Ambulatory Visit: Payer: Medicare Other | Admitting: Endocrinology

## 2016-09-11 DIAGNOSIS — E119 Type 2 diabetes mellitus without complications: Secondary | ICD-10-CM

## 2016-09-11 DIAGNOSIS — E785 Hyperlipidemia, unspecified: Secondary | ICD-10-CM

## 2016-09-11 LAB — COMPREHENSIVE METABOLIC PANEL
ALK PHOS: 46 U/L (ref 39–117)
ALT: 22 U/L (ref 0–53)
AST: 27 U/L (ref 0–37)
Albumin: 4.2 g/dL (ref 3.5–5.2)
BUN: 13 mg/dL (ref 6–23)
CHLORIDE: 106 meq/L (ref 96–112)
CO2: 30 mEq/L (ref 19–32)
Calcium: 9.2 mg/dL (ref 8.4–10.5)
Creatinine, Ser: 0.79 mg/dL (ref 0.40–1.50)
GFR: 105.58 mL/min (ref >60.00)
GLUCOSE: 79 mg/dL (ref 70–99)
POTASSIUM: 4.5 meq/L (ref 3.5–5.1)
SODIUM: 141 meq/L (ref 135–145)
TOTAL PROTEIN: 6.9 g/dL (ref 6.0–8.3)
Total Bilirubin: 1.2 mg/dL (ref 0.2–1.2)

## 2016-09-11 LAB — HEMOGLOBIN A1C: HEMOGLOBIN A1C: 6.6 % — AB (ref 4.6–6.5)

## 2016-09-12 ENCOUNTER — Ambulatory Visit (INDEPENDENT_AMBULATORY_CARE_PROVIDER_SITE_OTHER): Payer: Medicare Other | Admitting: Cardiovascular Disease

## 2016-09-12 ENCOUNTER — Encounter: Payer: Self-pay | Admitting: Cardiovascular Disease

## 2016-09-12 VITALS — BP 136/72 | HR 69 | Ht 72.0 in | Wt 317.2 lb

## 2016-09-12 DIAGNOSIS — I471 Supraventricular tachycardia: Secondary | ICD-10-CM | POA: Diagnosis not present

## 2016-09-12 LAB — LIPOPROTEIN ANALYSIS BY NMR
HDL Particle Number: 30.4 umol/L — ABNORMAL LOW (ref >=30.5)
LDL Particle Number: 816 nmol/L (ref <1000)
LDL Size: 20 nm (ref >20.5)
LP-IR Score: 64 — ABNORMAL HIGH (ref <=45)
SMALL LDL PARTICLE NUMBER: 566 nmol/L — AB (ref <=527)

## 2016-09-12 NOTE — Patient Instructions (Signed)

## 2016-09-12 NOTE — Progress Notes (Signed)
Cardiology Office Note   Date:  09/12/2016   ID:  Jake Ortiz, DOB 1956-02-21, MRN [id number]  PCP:  Penni Homans, MD  Cardiologist:   Mertie Moores, MD   Chief Complaint  Patient presents with  . Office Visit   Problem list: 1. Diabetes mellitus 2. Morbid obesity 3. Hyperlipidemia 4. Obstructive sleep apnea 5. Superventricular tachycardia   History of Present Illness: Jake Ortiz is a 60 y.o. male who presents for palpitations.  Has been having palpitations for year.   The palpitations feels like a flutter in his chest .  HR of 150 on occasion ( measured by his BP cuff)  Not related to eating or drinking . Or exercise No associated symptoms of CP or pre-syncope, no chest pressure.  Able to do his typical activity through the palpitations. Last several minutes - 10 minutes. Wife is a Marine scientist, she thinks it is irregular - like atrial fib.   Does not get any regular exercise,  Slowly getting back into working out. Does not have any CP or dyspnea when he goes for a walk.  Retired, was an Clinical biochemist.   01/22/2015:  I saw Jake Ortiz several weeks ago. He had a monitor placed. He was found have superventricular tachycardia. We started him on Toprol-XL 25 mg a day. Had one brief episode of tachycardia at the day that he started the prescription but has not noticed any type of palpitations.    Sept. 1, 2016:  Doing well.  Very few palpitations. Able to do all of his normal activities.   Oct. 24, 2017:  Jake Ortiz presents today for follow-up of his SVT. He's done well since we started him on Toprol-XL. He's not had any further episodes of SVT. Not exercising .  Is a retired Clinical biochemist   Past Medical History:  Diagnosis Date  . ALLERGIC RHINITIS, SEASONAL 01/25/2011  . BACK STRAIN, LUMBAR 01/25/2011  . Chronic pain syndrome   . Chronic radicular low back pain 01/25/2011   Qualifier: Diagnosis of  By: Charlett Blake MD, Stacey  1. Focal annular tear of the L4-5 disc  at the level of the right  neural foramen with a tiny disc protrusion compressing the right L4  nerve in the neural foramen. This is more prominent than on the  prior study.  2. 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  Follows with [facility] Genevie Ann [facility]  . COLONIC POLYPS, BENIGN 01/25/2011  . Cough 11/10/2013  . Degeneration of lumbar or lumbosacral intervertebral disc   . Diabetes mellitus   . DIVERTICULOSIS OF COLON 01/25/2011  . DM 01/25/2011  . Hypocalcemia 03/04/2012  . 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 LAE (left atrial enlargement) 06/27/2013  . Mixed hyperlipidemia 01/25/2011  . Morbid obesity ([place]) 01/25/2011  . Pain in right axilla 11/10/2013  . Personal history of colonic polyps- sessile serrated adenoma 12/21/2006  . Preventative health care 03/04/2012  . RBBB 01/25/2011   Qualifier: Diagnosis of  By: Charlett Blake MD, Erline Levine    . Renal cyst 05/07/2016  . SLEEP APNEA 01/25/2011  . Tinea pedis 11/15/2014  . Undiagnosed cardiac murmurs 01/25/2011    Past Surgical History:  Procedure Laterality Date  . CHOLECYSTECTOMY    . skin biopsy on abdomen     unknown results  . TONSILLECTOMY       Current Outpatient Prescriptions  Medication Sig Dispense Refill  . aspirin EC  81 MG tablet Take 1 tablet (81 mg total) by mouth daily.    Marland Kitchen atorvastatin (LIPITOR) 40 MG tablet Take 1 tablet (40 mg total) by mouth daily. 90 tablet 2  . Cholecalciferol (VITAMIN D-3 PO) Take 1,000 Int'l Units by mouth daily.     . cyclobenzaprine (FLEXERIL) 10 MG tablet Take 1 tablet (10 mg total) by mouth 2 (two) times daily as needed. For back pain 30 tablet 2  . fluticasone (FLONASE) 50 MCG/ACT nasal spray USE 2 SPRAYS IN EACH NOSTRIL DAILY 48 g 6  . glucose blood (BAYER CONTOUR NEXT TEST) test strip USE AS DIRECTED 4-6 TIMES A DAY TO CHECK BLOOD SUGAR 200 each 3  . glucose blood (BAYER CONTOUR NEXT TEST) test strip USE AS DIRECTED  4-6 TIMES A DAY TO CHECK BLOOD SUGAR 200 each 3  . glucose blood (BAYER CONTOUR NEXT TEST) test strip USE AS DIRECTED 4-6 TIMES A DAY TO CHECK BLOOD SUGAR 200 each 3  . glucose blood (BAYER CONTOUR NEXT TEST) test strip USE AS DIRECTED 4-6 TIMES A DAY TO CHECK BLOOD SUGAR 200 each 3  . HUMALOG 100 UNIT/ML injection INJECT VIA INSULIN PUMP PER SLIDING SCALE 250 mL 1  . HYDROcodone-acetaminophen (NORCO) 10-325 MG per tablet Take 1 tablet by mouth every 6 (six) hours as needed.     . INVOKANA 300 MG TABS tablet TAKE 1 TABLET DAILY BEFORE BREAKFAST 90 tablet 2  . losartan (COZAAR) 25 MG tablet Take 1 tablet (25 mg total) by mouth daily. 30 tablet 0  . metFORMIN (GLUCOPHAGE) 1000 MG tablet Take 1,000 mg by mouth 2 (two) times daily with a meal.    . metoprolol succinate (TOPROL-XL) 50 MG 24 hr tablet Take 1 tablet (50 mg total) by mouth daily. 90 tablet 0  . Omega-3 Fatty Acids (FISH OIL PO) Take 500 mg by mouth 3 (three) times daily.      No current facility-administered medications for this visit.     Allergies:   Review of patient's allergies indicates no known allergies.    Social History:  The patient  reports that he quit smoking about 25 years ago. He has a 30.00 pack-year smoking history. He has never used smokeless tobacco. He reports that he drinks alcohol. He reports that he does not use drugs.   Family History:  The patient's family history includes Allergies in his mother; Asthma in his daughter; Cancer in his mother; Coronary artery disease in his father, maternal grandmother, and sister; Diabetes in his brother, brother, daughter, father, and sister; Heart disease in his brother and sister; Hypertension in his brother; Kidney disease in his daughter; Obesity in his brother; Other in his brother, daughter, and father.    ROS:  Please see the history of present illness.    Review of Systems: Constitutional:  denies fever, chills, diaphoresis, appetite change and fatigue.  HEENT:  denies photophobia, eye pain, redness, hearing loss, ear pain, congestion, sore throat, rhinorrhea, sneezing, neck pain, neck stiffness and tinnitus.  Respiratory: denies SOB, DOE, cough, chest tightness, and wheezing.  Cardiovascular: denies chest pain, palpitations and leg swelling.  Gastrointestinal: denies nausea, vomiting, abdominal pain, diarrhea, constipation, blood in stool.  Genitourinary: denies dysuria, urgency, frequency, hematuria, flank pain and difficulty urinating.  Musculoskeletal: denies  myalgias, back pain, joint swelling, arthralgias and gait problem.   Skin: denies pallor, rash and wound.  Neurological: denies dizziness, seizures, syncope, weakness, light-headedness, numbness and headaches.   Hematological: denies adenopathy, easy bruising, personal or family  bleeding history.  Psychiatric/ Behavioral: denies suicidal ideation, mood changes, confusion, nervousness, sleep disturbance and agitation.       All other systems are reviewed and negative.    PHYSICAL EXAM: VS:  BP 136/72   Pulse 69   Ht 6' (1.829 m)   Wt (!) 317 lb 3.2 oz (143.9 kg)   SpO2 95%   BMI 43.02 kg/m  , BMI Body mass index is 43.02 kg/m. GEN: Well nourished, well developed, in no acute distress , moderately obese  HEENT: normal  Neck: no JVD, carotid bruits, or masses Cardiac: RRR; soft systolic  Murmur, no  rubs, or gallops, 1+2=  pitting edema.  Chronic stasis changes.   Respiratory:  clear to auscultation bilaterally, normal work of breathing GI: soft, nontender, nondistended, + BS, moderately obese  MS: no deformity or atrophy  Skin: warm and dry, no rash Neuro:  Strength and sensation are intact Psych: normal  EKG:   NSR at 69.   LAFB.   Recent Labs: 04/28/2016: Hemoglobin 13.1; Platelets 134.0; TSH 0.97 09/11/2016: ALT 22; BUN 13; Creatinine, Ser 0.79; Potassium 4.5; Sodium 141    Lipid Panel    Component Value Date/Time   CHOL 140 04/28/2016 1142   TRIG 236.0 (H) 04/28/2016  1142   HDL 36.90 (L) 04/28/2016 1142   CHOLHDL 4 04/28/2016 1142   VLDL 47.2 (H) 04/28/2016 1142   LDLCALC 62 11/02/2015 0958   LDLDIRECT 71.0 04/28/2016 1142      Wt Readings from Last 3 Encounters:  09/12/16 (!) 317 lb 3.2 oz (143.9 kg)  05/04/16 (!) 305 lb (138.3 kg)  04/28/16 (!) 307 lb (139.3 kg)      Other studies Reviewed: Additional studies/ records that were reviewed today include: . Review of the above records demonstrates:    ASSESSMENT AND PLAN:  1. Supraventricular tachycardia: The patient originally presented with palpitations. The event monitor revealed that he has episodes of SVT. We had not seen any evidence of atrial fibrillation.   2. Morbid obesity- I've encouraged him to exercise on a regular basis and to lose weight. 3. Leg edema :  Given him information on the Lounge Dr. leg rest on a previous visit .  4. Hyperlipidemia - continue current dose of atorvastatin.  Managed by his medical doctor .  5. Obstructive sleep apnea - wears CPAP  6. Diabetes mellitus - followed by his general medical doctor. 7. Essential hypertension:  His blood pressure is well-controlled.  Current medicines are reviewed at length with the patient today.  The patient does not have concerns regarding medicines.  The following changes have been made:  no change   Disposition:   FU with me in 12 months .    Signed, Mertie Moores, MD  09/12/2016 11:00 AM    [facility] [place], [place], [place]  [address] Phone: [phone]; Fax: [phone]

## 2016-09-14 ENCOUNTER — Ambulatory Visit (INDEPENDENT_AMBULATORY_CARE_PROVIDER_SITE_OTHER): Payer: Medicare Other | Admitting: Endocrinology

## 2016-09-14 ENCOUNTER — Other Ambulatory Visit: Payer: Self-pay | Admitting: Endocrinology

## 2016-09-14 ENCOUNTER — Encounter: Payer: Self-pay | Admitting: Endocrinology

## 2016-09-14 VITALS — BP 138/78 | HR 68 | Ht 72.0 in | Wt 316.0 lb

## 2016-09-14 DIAGNOSIS — E1140 Type 2 diabetes mellitus with diabetic neuropathy, unspecified: Secondary | ICD-10-CM | POA: Diagnosis not present

## 2016-09-14 DIAGNOSIS — E1165 Type 2 diabetes mellitus with hyperglycemia: Secondary | ICD-10-CM

## 2016-09-14 DIAGNOSIS — Z794 Long term (current) use of insulin: Secondary | ICD-10-CM | POA: Diagnosis not present

## 2016-09-14 DIAGNOSIS — E782 Mixed hyperlipidemia: Secondary | ICD-10-CM

## 2016-09-14 NOTE — Progress Notes (Signed)
Patient ID: Jake Ortiz, male   DOB: [birth year], 60 y.o.   MRN: [id number]           Reason for Appointment: Follow-up for Type 2 Diabetes    History of Present Illness:          Diagnosis: Type 2 diabetes mellitus, date of diagnosis: 1996       Past history:  He was initially treated with metformin at diagnosis in about 5 years later he was started on insulin He had been on Levemir and possibly other insulin regimens before going on an insulin pump in 2005 Apparently was put on insulin pump because of his large insulin requirement and poor control Also has been continued on metformin He  had fairly good A1c results in the past year although previously ranging from 7-7.9; in 07/2014 Was 6.1  Recent history:   He is on a Medtronic 723 insulin pump using a regimen of basal only with the pump and bolusing with Humalog using a syringe.  BASAL RATES: Midnight = 6.0, 4 AM = 6.5, 9 AM = 3.5, 1 PM = 4.5 and 10 PM = 5.0, total basal rate about 120 units per day BOLUSES: Carbohydrate coverage 1:1-1:2 with Humalog with a syringe, correction 1:10 with target 90-110  He has been on [facility]  since 3/16 which has helped his control , reduce insulin requirement and enable weight loss  His blood sugars have been previously well controlled with A1c upper normal but now it is nearly 1% higher at 6.6, previously 5.8  Current blood sugar patterns and management:  His fasting blood sugars are generally near normal and only occasionally higher  He thinks his higher A1c is related to his being out of town until recently and eating poorly  Again checking blood sugars mostly right before eating and only occasional readings after evening meal  LOWEST blood sugars are before lunch with some low sugars  He thinks that low sugars at lunch time may be Related to his being more active on some mornings, he does not however do any changes in his basal rate are morning mealtime insulin when he is doing  this  Also blood sugars AFTER supper are appearing to be generally well controlled; he may take a little extra insulin if sugars are higher before supper  Although he is generally taking a unit for each gram of carbohydrate he is usually reducing the insulin 5-10 units below the calculated amount.  He has gained weight since he had been out of town despite continuing Invokana    Hypoglycemia:  as above      Oral hypoglycemic drugs the patient is taking are: Metformin 1 g twice a day, Invokana 300 mg daily      Compliance with the medical regimen:  Variable recently    Glucose monitoring:  done 3-4 times a day         Glucometer: Contour    Blood Glucose readings by time of day and averages from pump download:  Mean values apply above for all meters except median for One Touch  PRE-MEAL Fasting Lunch Dinner PCS  Overall  Glucose range: 75-171  56-157  93-168  113-142    Mean/median: 129     118    Self-care: The diet that the patient has been following is: tries to limit high-fat meals   Meals: 3 meals per day. Breakfast is eggs and toast 9 am, has relatively more carbohydrates at lunch  Exercise:  trying to walk some periodically, Yard work, Usually late morning Dietician visit, most recent: None recently .               Weight history:  Wt Readings from Last 3 Encounters:  09/14/16 (!) 316 lb (143.3 kg)  09/12/16 (!) 317 lb 3.2 oz (143.9 kg)  05/04/16 (!) 305 lb (138.3 kg)    Glycemic control:     Lab Results  Component Value Date   HGBA1C 6.6 (H) 09/11/2016   HGBA1C 5.8 05/01/2016   HGBA1C 5.7 12/29/2015   Lab Results  Component Value Date   MICROALBUR 1.4 12/29/2015   LDLCALC 62 11/02/2015   CREATININE 0.79 09/11/2016    Other active problems: See review of systems     Medication List       Accurate as of 09/14/16  1:44 PM. Always use your most recent med list.          aspirin EC 81 MG tablet Take 1 tablet (81 mg total) by mouth  daily.   atorvastatin 40 MG tablet Commonly known as:  LIPITOR Take 1 tablet (40 mg total) by mouth daily.   cyclobenzaprine 10 MG tablet Commonly known as:  FLEXERIL Take 1 tablet (10 mg total) by mouth 2 (two) times daily as needed. For back pain   FISH OIL PO Take 500 mg by mouth 3 (three) times daily.   fluticasone 50 MCG/ACT nasal spray Commonly known as:  FLONASE USE 2 SPRAYS IN EACH NOSTRIL DAILY   glucose blood test strip Commonly known as:  BAYER CONTOUR NEXT TEST USE AS DIRECTED 4-6 TIMES A DAY TO CHECK BLOOD SUGAR   glucose blood test strip Commonly known as:  BAYER CONTOUR NEXT TEST USE AS DIRECTED 4-6 TIMES A DAY TO CHECK BLOOD SUGAR   glucose blood test strip Commonly known as:  BAYER CONTOUR NEXT TEST USE AS DIRECTED 4-6 TIMES A DAY TO CHECK BLOOD SUGAR   glucose blood test strip Commonly known as:  BAYER CONTOUR NEXT TEST USE AS DIRECTED 4-6 TIMES A DAY TO CHECK BLOOD SUGAR   HUMALOG 100 UNIT/ML injection Generic drug:  insulin lispro INJECT VIA INSULIN PUMP PER SLIDING SCALE   HYDROcodone-acetaminophen 10-325 MG tablet Commonly known as:  NORCO Take 1 tablet by mouth every 6 (six) hours as needed.   INVOKANA 300 MG Tabs tablet Generic drug:  canagliflozin TAKE 1 TABLET DAILY BEFORE BREAKFAST   losartan 25 MG tablet Commonly known as:  COZAAR Take 1 tablet (25 mg total) by mouth daily.   metFORMIN 1000 MG tablet Commonly known as:  GLUCOPHAGE Take 1,000 mg by mouth 2 (two) times daily with a meal.   metoprolol succinate 50 MG 24 hr tablet Commonly known as:  TOPROL-XL Take 1 tablet (50 mg total) by mouth daily.   VITAMIN D-3 PO Take 1,000 Int'l Units by mouth daily.       Allergies: No Known Allergies  Past Medical History:  Diagnosis Date  . ALLERGIC RHINITIS, SEASONAL 01/25/2011  . BACK STRAIN, LUMBAR 01/25/2011  . Chronic pain syndrome   . Chronic radicular low back pain 01/25/2011   Qualifier: Diagnosis of  By: Charlett Blake MD, Stacey  1.  Focal annular tear of the L4-5 disc at the level of the right  neural foramen with a tiny disc protrusion compressing the right L4  nerve in the neural foramen. This is more prominent than on the  prior study.  2. Chronic disc protrusion at L5-S1 central  and to the left with  no neural impingement.  Follows with Dr Genevie Ann [facility]  . COLONIC POLYPS, BENIGN 01/25/2011  . Cough 11/10/2013  . Degeneration of lumbar or lumbosacral intervertebral disc   . Diabetes mellitus   . DIVERTICULOSIS OF COLON 01/25/2011  . DM 01/25/2011  . Hypocalcemia 03/04/2012  . 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 LAE (left atrial enlargement) 06/27/2013  . Mixed hyperlipidemia 01/25/2011  . Morbid obesity ([place]) 01/25/2011  . Pain in right axilla 11/10/2013  . Personal history of colonic polyps- sessile serrated adenoma 12/21/2006  . Preventative health care 03/04/2012  . RBBB 01/25/2011   Qualifier: Diagnosis of  By: Charlett Blake MD, Erline Levine    . Renal cyst 05/07/2016  . SLEEP APNEA 01/25/2011  . Tinea pedis 11/15/2014  . Undiagnosed cardiac murmurs 01/25/2011    Past Surgical History:  Procedure Laterality Date  . CHOLECYSTECTOMY    . skin biopsy on abdomen     unknown results  . TONSILLECTOMY      Family History  Problem Relation Age of Onset  . Cancer Mother     metastatic breast ca  . Allergies Mother   . Diabetes Father   . Other Father     renal failure/ CHF  . Coronary artery disease Father     s/p MI  . Diabetes Sister     Type 1  . Coronary artery disease Sister     s/p stents  . Heart disease Sister   . Obesity Brother   . Other Brother     Sport and exercise psychologist  . Asthma Daughter   . Other Daughter     back disease  . Diabetes Daughter   . Coronary artery disease Maternal Grandmother   . Diabetes Brother   . Heart disease Brother     s/p CABG  . Diabetes Brother     diet and oral meds  . Hypertension Brother   . Kidney disease Daughter     in childhood   . Colon cancer Neg Hx   . Pancreatic cancer Neg Hx   . Stomach cancer Neg Hx     Social History:  reports that he quit smoking about 25 years ago. He has a 30.00 pack-year smoking history. He has never used smokeless tobacco. He reports that he drinks alcohol. He reports that he does not use drugs.    Review of Systems    Most recent eye exam was in 6/17, has background retinopathy        Lipids: Under control with Lipitor.  Has history of high triglycerides  Currently taking 40 mg Lipid panel followed by PCP Although his LDL particle number is below 1000 he has relatively higher small LDL particles and small LDL size        Lab Results  Component Value Date   CHOL 140 04/28/2016   HDL 36.90 (L) 04/28/2016   LDLCALC 62 11/02/2015   LDLDIRECT 71.0 04/28/2016   TRIG 236.0 (H) 04/28/2016   CHOLHDL 4 04/28/2016          The blood pressure has been controlled with taking low dose losartan and metoprolol, likely benefiting from Invokana      LABS:  Lab on 09/11/2016  Component Date Value Ref Range Status  . Hgb A1c MFr Bld 09/11/2016 6.6* 4.6 - 6.5 % Final  . Sodium 09/11/2016 141  135 - 145 mEq/L Final  . Potassium 09/11/2016 4.5  3.5 - 5.1  mEq/L Final  . Chloride 09/11/2016 106  96 - 112 mEq/L Final  . CO2 09/11/2016 30  19 - 32 mEq/L Final  . Glucose, Bld 09/11/2016 79  70 - 99 mg/dL Final  . BUN 09/11/2016 13  6 - 23 mg/dL Final  . Creatinine, Ser 09/11/2016 0.79  0.40 - 1.50 mg/dL Final  . Total Bilirubin 09/11/2016 1.2  0.2 - 1.2 mg/dL Final  . Alkaline Phosphatase 09/11/2016 46  39 - 117 U/L Final  . AST 09/11/2016 27  0 - 37 U/L Final  . ALT 09/11/2016 22  0 - 53 U/L Final  . Total Protein 09/11/2016 6.9  6.0 - 8.3 g/dL Final  . Albumin 09/11/2016 4.2  3.5 - 5.2 g/dL Final  . Calcium 09/11/2016 9.2  8.4 - 10.5 mg/dL Final  . GFR 09/11/2016 105.58  >60.00 mL/min Final  . LDL Particle Number 09/12/2016 [id number]  <1,000 nmol/L Final   Comment:                            Low                   < 1000                           Moderate         1000 - 1299                           Borderline-High  1300 - 1599                           High             1600 - 2000                           Very High             > 2000   . HDL Particle Number 09/12/2016 30.4* >=30.5 umol/L Final  . Small LDL Particle Number 09/12/2016 [id number]* <=527 nmol/L Final  . LDL Size 09/12/2016 20.0  >20.5 nm Final   Comment:  ----------------------------------------------------------                  ** INTERPRETATIVE INFORMATION**                  PARTICLE CONCENTRATION AND SIZE                     <--Lower CVD Risk   Higher CVD Risk-->   LDL AND HDL PARTICLES   Percentile in Reference Population   HDL-P (total)        High     75th    50th    25th   Low                        >34.9    34.9    30.5    26.7   <26.7   Small LDL-P          Low      25th    50th    75th   High                        <  117     117     527     839    >839   LDL Size   <-Large (Pattern A)->    <-Small (Pattern B)->                     23.0    20.6           20.5      19.0  ---------------------------------------------------------- Small LDL-P and LDL Size are associated with CVD risk, but not after LDL-P is taken into account. These assays were developed and their performance characteristics determined by [employer]. These assays have not been cleared by the [place] Food and Drug Administration. The clinical utility o                          f these laboratory values have not been fully established.   Marland Kitchen LP-IR Score 09/12/2016 64* <=45 Final   Comment: INSULIN RESISTANCE MARKER     <--Insulin Sensitive    Insulin Resistant-->            Percentile in Reference Population Insulin Resistance Score LP-IR Score   Low   25th   50th   75th   High               <27   27     45     63     >63 LP-IR Score is inaccurate if patient is non-fasting. The LP-IR score is a laboratory developed index that has  been associated with insulin resistance and diabetes risk and should be used as one component of a physician's clinical assessment. The LP-IR score listed above has not been cleared by the [place] Food and Drug Administration.     Physical Examination:  BP 138/78   Pulse 68   Ht 6' (1.829 m)   Wt (!) 316 lb (143.3 kg)   SpO2 96%   BMI 42.86 kg/m   2+ ankle edema present  Diabetic Foot Exam - Simple   Simple Foot Form Diabetic Foot exam was performed with the following findings:  Yes 09/14/2016 10:39 AM  Visual Inspection No deformities, no ulcerations, no other skin breakdown bilaterally:  Yes Sensation Testing See comments:  Yes Pulse Check Posterior Tibialis and Dorsalis pulse intact bilaterally:  Yes Comments He does have mild decrease in monofilament sensation in the toes        ASSESSMENT:  Diabetes type 2, uncontrolled with  obesity and insulin resistant See history of present illness for detailed discussion of his current management, blood sugar patterns and problems identified     A1c is higher than usual at 6.6 This is primarily from poor compliance with diet over the last few months However recently blood sugars appear to be better Tends to have hypoglycemia when he is active and does not reduce his basal rate for this. Does not do enough postprandial readings but they don't seem to be high or low consistently or whenever he checks them after meals at night He is a good candidate for GLP-1 drug and this was discussed  HYPERLIPIDEMIA: His LDL particle number is below 1000 Has small LDL particles which may improve with weight loss and better diet   PLAN:  He will use a temporary basal of 50% when he is trying to be more active outside to prevent low sugars Currently will keep the same settings for his pump. He needs to  consistently work on diet and exercise to lose weight He will call if he is interested in going on Victoza or Trulicity No change in  Invokana   Counseling time on subjects discussed above is over 50% of today's 25 minute visit   Lyla Jasek 09/14/2016, 1:44 PM   Note: This office note was prepared with Estate agent. Any transcriptional errors that result from this process are unintentional.

## 2016-09-15 ENCOUNTER — Encounter: Payer: Self-pay | Admitting: Family Medicine

## 2016-09-15 ENCOUNTER — Ambulatory Visit (INDEPENDENT_AMBULATORY_CARE_PROVIDER_SITE_OTHER): Payer: Medicare Other | Admitting: Family Medicine

## 2016-09-15 VITALS — BP 138/72 | HR 78 | Temp 98.4°F | Ht 73.0 in | Wt 314.5 lb

## 2016-09-15 DIAGNOSIS — D696 Thrombocytopenia, unspecified: Secondary | ICD-10-CM

## 2016-09-15 DIAGNOSIS — E1169 Type 2 diabetes mellitus with other specified complication: Secondary | ICD-10-CM | POA: Diagnosis not present

## 2016-09-15 DIAGNOSIS — I10 Essential (primary) hypertension: Secondary | ICD-10-CM | POA: Diagnosis not present

## 2016-09-15 DIAGNOSIS — E782 Mixed hyperlipidemia: Secondary | ICD-10-CM | POA: Diagnosis not present

## 2016-09-15 DIAGNOSIS — E669 Obesity, unspecified: Secondary | ICD-10-CM

## 2016-09-15 LAB — CBC WITH DIFFERENTIAL/PLATELET
BASOS ABS: 0 10*3/uL (ref 0.0–0.1)
Basophils Relative: 0.6 % (ref 0.0–3.0)
EOS ABS: 0.3 10*3/uL (ref 0.0–0.7)
Eosinophils Relative: 5.3 % — ABNORMAL HIGH (ref 0.0–5.0)
HCT: 42.4 % (ref 39.0–52.0)
Hemoglobin: 13.2 g/dL (ref 13.0–17.0)
LYMPHS ABS: 1.6 10*3/uL (ref 0.7–4.0)
Lymphocytes Relative: 26.6 % (ref 12.0–46.0)
MCHC: 31.2 g/dL (ref 30.0–36.0)
MCV: 68.2 fl — ABNORMAL LOW (ref 78.0–100.0)
MONO ABS: 0.5 10*3/uL (ref 0.1–1.0)
MONOS PCT: 9 % (ref 3.0–12.0)
NEUTROS ABS: 3.5 10*3/uL (ref 1.4–7.7)
NEUTROS PCT: 58.5 % (ref 43.0–77.0)
PLATELETS: 137 10*3/uL — AB (ref 150.0–400.0)
RBC: 6.22 Mil/uL — AB (ref 4.22–5.81)
RDW: 17.9 % — ABNORMAL HIGH (ref 11.5–15.5)
WBC: 6 10*3/uL (ref 4.0–10.5)

## 2016-09-15 LAB — LIPID PANEL
CHOLESTEROL: 116 mg/dL (ref 0–200)
HDL: 38.7 mg/dL — AB (ref >39.00)
LDL Cholesterol: 53 mg/dL (ref 0–99)
NonHDL: 77.23
TRIGLYCERIDES: 122 mg/dL (ref 0.0–149.0)
Total CHOL/HDL Ratio: 3
VLDL: 24.4 mg/dL (ref 0.0–40.0)

## 2016-09-15 NOTE — Progress Notes (Signed)
Pre visit review using our clinic review tool, if applicable. No additional management support is needed unless otherwise documented below in the visit note. 

## 2016-09-15 NOTE — Patient Instructions (Signed)

## 2016-09-15 NOTE — Assessment & Plan Note (Signed)
Encouraged DASH diet, decrease po intake and increase exercise as tolerated. Needs 7-8 hours of sleep nightly. Avoid trans fats, eat small, frequent meals every 4-5 hours with lean proteins, complex carbs and healthy fats. Minimize simple carbs 

## 2016-09-15 NOTE — Assessment & Plan Note (Signed)
Well controlled, no changes to meds. Encouraged heart healthy diet such as the DASH diet and exercise as tolerated.  °

## 2016-09-15 NOTE — Progress Notes (Signed)
Patient ID: Jake Ortiz, male   DOB: 1955/11/26, 60 y.o.   MRN: [id number]   Subjective:    Patient ID: Jake Ortiz, male    DOB: Sep 14, 1956, 60 y.o.   MRN: [id number]  Chief Complaint  Patient presents with  . Follow-up    HPI Patient is in today for follow up. He is doing well back in town for a few days after traveling around the country for the past few months. They now live out of their. motor home and are headed to [facility] next. He has chosen not to have his renal cyst worked up thus far due to his travels and being asymptomatic. Denies CP/palp/SOB/HA/congestion/fevers/GI or GU c/o. Taking meds as prescribed. No polyuria or polydipsia. Is notingmild b/l pedal edema with long driving days.   Past Medical History:  Diagnosis Date  . ALLERGIC RHINITIS, SEASONAL 01/25/2011  . BACK STRAIN, LUMBAR 01/25/2011  . Chronic pain syndrome   . Chronic radicular low back pain 01/25/2011   Qualifier: Diagnosis of  By: Charlett Blake MD, Darcy Barbara  1. Focal annular tear of the L4-5 disc at the level of the right  neural foramen with a tiny disc protrusion compressing the right L4  nerve in the neural foramen. This is more prominent than on the  prior study.  2. 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  Follows with Dr Genevie Ann [facility]  . COLONIC POLYPS, BENIGN 01/25/2011  . Cough 11/10/2013  . Degeneration of lumbar or lumbosacral intervertebral disc   . Diabetes mellitus   . DIVERTICULOSIS OF COLON 01/25/2011  . DM 01/25/2011  . Hypocalcemia 03/04/2012  . 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 LAE (left atrial enlargement) 06/27/2013  . Mixed hyperlipidemia 01/25/2011  . Morbid obesity ([place]) 01/25/2011  . Pain in right axilla 11/10/2013  . Personal history of colonic polyps- sessile serrated adenoma 12/21/2006  . Preventative health care 03/04/2012  . RBBB 01/25/2011   Qualifier: Diagnosis of  By: Charlett Blake MD, Erline Levine     . Renal cyst 05/07/2016  . SLEEP APNEA 01/25/2011  . Tinea pedis 11/15/2014  . Undiagnosed cardiac murmurs 01/25/2011    Past Surgical History:  Procedure Laterality Date  . CHOLECYSTECTOMY    . skin biopsy on abdomen     unknown results  . TONSILLECTOMY      Family History  Problem Relation Age of Onset  . Cancer Mother     metastatic breast ca  . Allergies Mother   . Diabetes Father   . Other Father     renal failure/ CHF  . Coronary artery disease Father     s/p MI  . Diabetes Sister     Type 1  . Coronary artery disease Sister     s/p stents  . Heart disease Sister   . Obesity Brother   . Other Brother     Sport and exercise psychologist  . Asthma Daughter   . Other Daughter     back disease  . Diabetes Daughter   . Coronary artery disease Maternal Grandmother   . Diabetes Brother   . Heart disease Brother     s/p CABG  . Diabetes Brother     diet and oral meds  . Hypertension Brother   . Kidney disease Daughter     in childhood  . Colon cancer Neg Hx   . Pancreatic cancer Neg Hx   .  Stomach cancer Neg Hx     Social History   Social History  . Marital status: Married    Spouse name: N/A  . Number of children: 4  . Years of education: N/A   Occupational History  . retired    Social History Main Topics  . Smoking status: Former Smoker    Packs/day: 3.00    Years: 10.00    Quit date: 11/20/1990  . Smokeless tobacco: Never Used  . Alcohol use Yes     Comment: occasional  . Drug use: No  . Sexual activity: Yes   Other Topics Concern  . Not on file   Social History Narrative  . No narrative on file    Outpatient Medications Prior to Visit  Medication Sig Dispense Refill  . aspirin EC 81 MG tablet Take 1 tablet (81 mg total) by mouth daily.    Marland Kitchen atorvastatin (LIPITOR) 40 MG tablet Take 1 tablet (40 mg total) by mouth daily. 90 tablet 2  . Cholecalciferol (VITAMIN D-3 PO) Take 1,000 Int'l Units by mouth daily.     . cyclobenzaprine (FLEXERIL) 10 MG tablet  Take 1 tablet (10 mg total) by mouth 2 (two) times daily as needed. For back pain 30 tablet 2  . fluticasone (FLONASE) 50 MCG/ACT nasal spray USE 2 SPRAYS IN EACH NOSTRIL DAILY 48 g 6  . glucose blood (BAYER CONTOUR NEXT TEST) test strip USE AS DIRECTED 4-6 TIMES A DAY TO CHECK BLOOD SUGAR 200 each 3  . glucose blood (BAYER CONTOUR NEXT TEST) test strip USE AS DIRECTED 4-6 TIMES A DAY TO CHECK BLOOD SUGAR 200 each 3  . glucose blood (BAYER CONTOUR NEXT TEST) test strip USE AS DIRECTED 4-6 TIMES A DAY TO CHECK BLOOD SUGAR 200 each 3  . glucose blood (BAYER CONTOUR NEXT TEST) test strip USE AS DIRECTED 4-6 TIMES A DAY TO CHECK BLOOD SUGAR 200 each 3  . HUMALOG 100 UNIT/ML injection INJECT VIA INSULIN PUMP PER SLIDING SCALE 250 mL 1  . HYDROcodone-acetaminophen (NORCO) 10-325 MG per tablet Take 1 tablet by mouth every 6 (six) hours as needed.     . INVOKANA 300 MG TABS tablet TAKE 1 TABLET DAILY BEFORE BREAKFAST 90 tablet 2  . losartan (COZAAR) 25 MG tablet Take 1 tablet (25 mg total) by mouth daily. 30 tablet 0  . metFORMIN (GLUCOPHAGE) 1000 MG tablet TAKE 2 TABLETS DAILY 180 tablet 1  . metoprolol succinate (TOPROL-XL) 50 MG 24 hr tablet Take 1 tablet (50 mg total) by mouth daily. 90 tablet 0  . Omega-3 Fatty Acids (FISH OIL PO) Take 500 mg by mouth 3 (three) times daily.      No facility-administered medications prior to visit.     No Known Allergies  Review of Systems  Constitutional: Negative for fever and malaise/fatigue.  HENT: Negative for congestion.   Eyes: Negative for blurred vision.  Respiratory: Negative for shortness of breath.   Cardiovascular: Positive for leg swelling. Negative for chest pain and palpitations.  Gastrointestinal: Negative for abdominal pain, blood in stool and nausea.  Genitourinary: Negative for dysuria and frequency.  Musculoskeletal: Negative for falls.  Skin: Negative for rash.  Neurological: Positive for speech change. Negative for dizziness, loss of  consciousness and headaches.  Endo/Heme/Allergies: Negative for environmental allergies.  Psychiatric/Behavioral: Negative for depression. The patient is not nervous/anxious.        Objective:    Physical Exam  Constitutional: He is oriented to person, place, and time. He appears  well-developed and well-nourished. No distress.  HENT:  Head: Normocephalic and atraumatic.  Nose: Nose normal.  Eyes: Right eye exhibits no discharge. Left eye exhibits no discharge.  Neck: Normal range of motion. Neck supple.  Cardiovascular: Normal rate and regular rhythm.   No murmur heard. Pulmonary/Chest: Effort normal and breath sounds normal.  Abdominal: Soft. Bowel sounds are normal. There is no tenderness.  Musculoskeletal: He exhibits edema.  Neurological: He is alert and oriented to person, place, and time.  Skin: Skin is warm and dry.  Psychiatric: He has a normal mood and affect.  Nursing note and vitals reviewed.   BP 138/72 (BP Location: Left Arm, Patient Position: Sitting, Cuff Size: Large)   Pulse 78   Temp 98.4 F (36.9 C) (Oral)   Ht 6\' 1"  (1.854 m)   Wt (!) 314 lb 8 oz (142.7 kg)   SpO2 94%   BMI 41.49 kg/m  Wt Readings from Last 3 Encounters:  09/15/16 (!) 314 lb 8 oz (142.7 kg)  09/14/16 (!) 316 lb (143.3 kg)  09/12/16 (!) 317 lb 3.2 oz (143.9 kg)     Lab Results  Component Value Date   WBC 6.2 04/28/2016   HGB 13.1 04/28/2016   HCT 42.3 04/28/2016   PLT 134.0 (L) 04/28/2016   GLUCOSE 79 09/11/2016   CHOL 140 04/28/2016   TRIG 236.0 (H) 04/28/2016   HDL 36.90 (L) 04/28/2016   LDLDIRECT 71.0 04/28/2016   LDLCALC 62 11/02/2015   ALT 22 09/11/2016   AST 27 09/11/2016   NA 141 09/11/2016   K 4.5 09/11/2016   CL 106 09/11/2016   CREATININE 0.79 09/11/2016   BUN 13 09/11/2016   CO2 30 09/11/2016   TSH 0.97 04/28/2016   PSA 0.91 11/02/2015   HGBA1C 6.6 (H) 09/11/2016   MICROALBUR 1.4 12/29/2015    Lab Results  Component Value Date   TSH 0.97 04/28/2016    Lab Results  Component Value Date   WBC 6.2 04/28/2016   HGB 13.1 04/28/2016   HCT 42.3 04/28/2016   MCV 68.3 Repeated and verified X2. (L) 04/28/2016   PLT 134.0 (L) 04/28/2016   Lab Results  Component Value Date   NA 141 09/11/2016   K 4.5 09/11/2016   CO2 30 09/11/2016   GLUCOSE 79 09/11/2016   BUN 13 09/11/2016   CREATININE 0.79 09/11/2016   BILITOT 1.2 09/11/2016   ALKPHOS 46 09/11/2016   AST 27 09/11/2016   ALT 22 09/11/2016   PROT 6.9 09/11/2016   ALBUMIN 4.2 09/11/2016   CALCIUM 9.2 09/11/2016   GFR 105.58 09/11/2016   Lab Results  Component Value Date   CHOL 140 04/28/2016   Lab Results  Component Value Date   HDL 36.90 (L) 04/28/2016   Lab Results  Component Value Date   LDLCALC 62 11/02/2015   Lab Results  Component Value Date   TRIG 236.0 (H) 04/28/2016   Lab Results  Component Value Date   CHOLHDL 4 04/28/2016   Lab Results  Component Value Date   HGBA1C 6.6 (H) 09/11/2016       Assessment & Plan:   Problem List Items Addressed This Visit    Diabetes mellitus type 2 in obese (HCC) (Chronic)    hgba1c acceptable, minimize simple carbs. Increase exercise as tolerated. Continue current meds      Mixed hyperlipidemia    Tolerating statin, encouraged heart healthy diet, avoid trans fats, minimize simple carbs and saturated fats. Increase exercise as tolerated  Morbid obesity ([place])    Encouraged DASH diet, decrease po intake and increase exercise as tolerated. Needs 7-8 hours of sleep nightly. Avoid trans fats, eat small, frequent meals every 4-5 hours with lean proteins, complex carbs and healthy fats. Minimize simple carbs      HTN (hypertension)    Well controlled, no changes to meds. Encouraged heart healthy diet such as the DASH diet and exercise as tolerated.        Other Visit Diagnoses    Thrombocytopenia ([place])    -  Primary   Relevant Orders   CBC w/Diff   Hyperlipidemia, mixed       Relevant Orders   Lipid panel       I am having [name] maintain his HYDROcodone-acetaminophen, Omega-3 Fatty Acids (FISH OIL PO), Cholecalciferol (VITAMIN D-3 PO), cyclobenzaprine, aspirin EC, INVOKANA, atorvastatin, glucose blood, glucose blood, glucose blood, glucose blood, metoprolol succinate, losartan, fluticasone, metFORMIN, and HUMALOG.  No orders of the defined types were placed in this encounter.    Penni Homans, MD

## 2016-09-15 NOTE — Assessment & Plan Note (Signed)
Tolerating statin, encouraged heart healthy diet, avoid trans fats, minimize simple carbs and saturated fats. Increase exercise as tolerated 

## 2016-09-15 NOTE — Assessment & Plan Note (Signed)
hgba1c acceptable, minimize simple carbs. Increase exercise as tolerated. Continue current meds 

## 2016-09-28 ENCOUNTER — Other Ambulatory Visit: Payer: Self-pay | Admitting: Cardiovascular Disease

## 2016-10-13 ENCOUNTER — Other Ambulatory Visit: Payer: Self-pay | Admitting: Endocrinology

## 2016-12-26 ENCOUNTER — Telehealth: Payer: Self-pay | Admitting: Family Medicine

## 2016-12-26 NOTE — Telephone Encounter (Signed)
Patient scheduled AWV with Dr. Charlett Blake for 05/24/2017

## 2017-01-11 ENCOUNTER — Other Ambulatory Visit: Payer: Self-pay | Admitting: Family Medicine

## 2017-01-17 DIAGNOSIS — I10 Essential (primary) hypertension: Secondary | ICD-10-CM | POA: Diagnosis not present

## 2017-01-17 DIAGNOSIS — E559 Vitamin D deficiency, unspecified: Secondary | ICD-10-CM | POA: Diagnosis not present

## 2017-01-17 DIAGNOSIS — Z6841 Body Mass Index (BMI) 40.0 and over, adult: Secondary | ICD-10-CM | POA: Diagnosis not present

## 2017-01-17 DIAGNOSIS — E1165 Type 2 diabetes mellitus with hyperglycemia: Secondary | ICD-10-CM | POA: Diagnosis not present

## 2017-01-17 DIAGNOSIS — E782 Mixed hyperlipidemia: Secondary | ICD-10-CM | POA: Diagnosis not present

## 2017-01-24 DIAGNOSIS — I10 Essential (primary) hypertension: Secondary | ICD-10-CM | POA: Diagnosis not present

## 2017-01-24 DIAGNOSIS — E782 Mixed hyperlipidemia: Secondary | ICD-10-CM | POA: Diagnosis not present

## 2017-01-24 DIAGNOSIS — E1165 Type 2 diabetes mellitus with hyperglycemia: Secondary | ICD-10-CM | POA: Diagnosis not present

## 2017-01-24 DIAGNOSIS — E559 Vitamin D deficiency, unspecified: Secondary | ICD-10-CM | POA: Diagnosis not present

## 2017-01-24 DIAGNOSIS — Z6841 Body Mass Index (BMI) 40.0 and over, adult: Secondary | ICD-10-CM | POA: Diagnosis not present

## 2017-02-24 ENCOUNTER — Other Ambulatory Visit: Payer: Self-pay | Admitting: Endocrinology

## 2017-03-30 ENCOUNTER — Other Ambulatory Visit: Payer: Self-pay | Admitting: Family Medicine

## 2017-04-13 ENCOUNTER — Encounter: Payer: Self-pay | Admitting: Family Medicine

## 2017-04-13 ENCOUNTER — Other Ambulatory Visit: Payer: Self-pay | Admitting: Family Medicine

## 2017-04-13 DIAGNOSIS — I10 Essential (primary) hypertension: Secondary | ICD-10-CM

## 2017-04-13 DIAGNOSIS — N281 Cyst of kidney, acquired: Secondary | ICD-10-CM

## 2017-04-13 DIAGNOSIS — R109 Unspecified abdominal pain: Secondary | ICD-10-CM

## 2017-04-13 DIAGNOSIS — E119 Type 2 diabetes mellitus without complications: Secondary | ICD-10-CM

## 2017-04-26 ENCOUNTER — Other Ambulatory Visit: Payer: Medicare Other

## 2017-04-26 ENCOUNTER — Ambulatory Visit (HOSPITAL_BASED_OUTPATIENT_CLINIC_OR_DEPARTMENT_OTHER)
Admission: RE | Admit: 2017-04-26 | Discharge: 2017-04-26 | Disposition: A | Discharge status: Home / Self Care | Payer: Medicare Other | Source: Ambulatory Visit | Attending: Family Medicine | Admitting: Family Medicine

## 2017-04-26 ENCOUNTER — Other Ambulatory Visit (INDEPENDENT_AMBULATORY_CARE_PROVIDER_SITE_OTHER): Payer: Medicare Other

## 2017-04-26 DIAGNOSIS — Z9049 Acquired absence of other specified parts of digestive tract: Secondary | ICD-10-CM | POA: Diagnosis not present

## 2017-04-26 DIAGNOSIS — N281 Cyst of kidney, acquired: Secondary | ICD-10-CM | POA: Diagnosis not present

## 2017-04-26 DIAGNOSIS — I10 Essential (primary) hypertension: Secondary | ICD-10-CM | POA: Diagnosis not present

## 2017-04-26 DIAGNOSIS — E119 Type 2 diabetes mellitus without complications: Secondary | ICD-10-CM | POA: Diagnosis not present

## 2017-04-26 DIAGNOSIS — R109 Unspecified abdominal pain: Secondary | ICD-10-CM | POA: Diagnosis not present

## 2017-04-26 DIAGNOSIS — R161 Splenomegaly, not elsewhere classified: Secondary | ICD-10-CM | POA: Diagnosis not present

## 2017-04-26 LAB — COMPREHENSIVE METABOLIC PANEL
ALBUMIN: 4.1 g/dL (ref 3.5–5.2)
ALT: 21 U/L (ref 0–53)
AST: 23 U/L (ref 0–37)
Alkaline Phosphatase: 46 U/L (ref 39–117)
BUN: 12 mg/dL (ref 6–23)
CHLORIDE: 107 meq/L (ref 96–112)
CO2: 28 meq/L (ref 19–32)
Calcium: 9.1 mg/dL (ref 8.4–10.5)
Creatinine, Ser: 0.93 mg/dL (ref 0.40–1.50)
GFR: 87.28 mL/min (ref >60.00)
Glucose, Bld: 142 mg/dL — ABNORMAL HIGH (ref 70–99)
POTASSIUM: 4.2 meq/L (ref 3.5–5.1)
SODIUM: 141 meq/L (ref 135–145)
Total Bilirubin: 1 mg/dL (ref 0.2–1.2)
Total Protein: 6.8 g/dL (ref 6.0–8.3)

## 2017-04-26 LAB — URINALYSIS
BILIRUBIN URINE: NEGATIVE
Hgb urine dipstick: NEGATIVE
KETONES UR: NEGATIVE
Leukocytes, UA: NEGATIVE
Nitrite: NEGATIVE
PH: 6 (ref 5.0–8.0)
Specific Gravity, Urine: 1.015 (ref 1.000–1.030)
TOTAL PROTEIN, URINE-UPE24: NEGATIVE
Urine Glucose: >=1000 — AB
Urobilinogen, UA: 1 (ref 0.0–1.0)

## 2017-04-26 LAB — CBC
HEMATOCRIT: 43 % (ref 39.0–52.0)
Hemoglobin: 13.5 g/dL (ref 13.0–17.0)
MCHC: 31.4 g/dL (ref 30.0–36.0)
MCV: 68.2 fl — ABNORMAL LOW (ref 78.0–100.0)
Platelets: 134 10*3/uL — ABNORMAL LOW (ref 150.0–400.0)
RBC: 6.3 Mil/uL — ABNORMAL HIGH (ref 4.22–5.81)
RDW: 18.4 % — AB (ref 11.5–15.5)
WBC: 6.1 10*3/uL (ref 4.0–10.5)

## 2017-04-26 LAB — LIPID PANEL
Cholesterol: 126 mg/dL (ref 0–200)
HDL: 37.3 mg/dL — AB (ref >39.00)
LDL Cholesterol: 61 mg/dL (ref 0–99)
NONHDL: 88.67
Total CHOL/HDL Ratio: 3
Triglycerides: 137 mg/dL (ref 0.0–149.0)
VLDL: 27.4 mg/dL (ref 0.0–40.0)

## 2017-04-26 LAB — MICROALBUMIN / CREATININE URINE RATIO
CREATININE, URINE: 91 mg/dL (ref 20–370)
MICROALB UR: 1.4 mg/dL
MICROALB/CREAT RATIO: 15 ug/mg{creat} (ref <30)

## 2017-04-26 LAB — HEMOGLOBIN A1C: Hgb A1c MFr Bld: 6.8 % — ABNORMAL HIGH (ref 4.6–6.5)

## 2017-04-26 LAB — TSH: TSH: 1.18 u[IU]/mL (ref 0.35–4.50)

## 2017-04-30 ENCOUNTER — Other Ambulatory Visit (HOSPITAL_BASED_OUTPATIENT_CLINIC_OR_DEPARTMENT_OTHER): Payer: Medicare Other

## 2017-05-11 ENCOUNTER — Ambulatory Visit (INDEPENDENT_AMBULATORY_CARE_PROVIDER_SITE_OTHER): Payer: Medicare Other | Admitting: Physician Assistant

## 2017-05-11 ENCOUNTER — Encounter: Payer: Self-pay | Admitting: Physician Assistant

## 2017-05-11 ENCOUNTER — Other Ambulatory Visit (INDEPENDENT_AMBULATORY_CARE_PROVIDER_SITE_OTHER): Payer: Medicare Other

## 2017-05-11 VITALS — BP 118/62 | HR 68 | Ht 73.0 in | Wt 323.8 lb

## 2017-05-11 DIAGNOSIS — E1165 Type 2 diabetes mellitus with hyperglycemia: Secondary | ICD-10-CM

## 2017-05-11 DIAGNOSIS — Z794 Long term (current) use of insulin: Secondary | ICD-10-CM

## 2017-05-11 DIAGNOSIS — I471 Supraventricular tachycardia: Secondary | ICD-10-CM

## 2017-05-11 DIAGNOSIS — I10 Essential (primary) hypertension: Secondary | ICD-10-CM

## 2017-05-11 LAB — COMPREHENSIVE METABOLIC PANEL
ALBUMIN: 4 g/dL (ref 3.5–5.2)
ALT: 21 U/L (ref 0–53)
AST: 25 U/L (ref 0–37)
Alkaline Phosphatase: 41 U/L (ref 39–117)
BILIRUBIN TOTAL: 1 mg/dL (ref 0.2–1.2)
BUN: 11 mg/dL (ref 6–23)
CO2: 28 mEq/L (ref 19–32)
CREATININE: 0.84 mg/dL (ref 0.40–1.50)
Calcium: 9.2 mg/dL (ref 8.4–10.5)
Chloride: 106 mEq/L (ref 96–112)
GFR: 98.15 mL/min (ref >60.00)
GLUCOSE: 93 mg/dL (ref 70–99)
POTASSIUM: 4 meq/L (ref 3.5–5.1)
SODIUM: 141 meq/L (ref 135–145)
TOTAL PROTEIN: 6.2 g/dL (ref 6.0–8.3)

## 2017-05-11 LAB — MICROALBUMIN / CREATININE URINE RATIO
Creatinine,U: 58 mg/dL
Microalb Creat Ratio: 1.2 mg/g (ref 0.0–30.0)

## 2017-05-11 LAB — HEMOGLOBIN A1C: Hgb A1c MFr Bld: 6.7 % — ABNORMAL HIGH (ref 4.6–6.5)

## 2017-05-11 NOTE — Patient Instructions (Signed)
Medication Instructions:  No changes.   Labwork: None   Testing/Procedures: None   Follow-Up: Dr. Liam Rogers in 1 year.   Any Other Special Instructions Will Be Listed Below (If Applicable).  If you need a refill on your cardiac medications before your next appointment, please call your pharmacy.

## 2017-05-11 NOTE — Progress Notes (Signed)
Cardiology Office Note:    Date:  05/11/2017   ID:  Jake Ortiz, DOB 07/05/1956, MRN [id number]  PCP:  Mosie Lukes, MD  Cardiologist:  Dr. Liam Rogers    Referring MD: Mosie Lukes, MD   Chief Complaint  Patient presents with  . Follow-up    SVT    History of Present Illness:    Jake Ortiz is a 61 y.o. male with a hx of SVT, DM, obesity, OSA, HL.  SVT has been controlled on beta-blocker.  He last saw Dr. Liam Rogers in 08/2016.    Jake Ortiz returns for Cardiology follow up.  He is here alone.  He is doing well.  He denies chest pain, shortness of breath, syncope, paroxysmal nocturnal dyspnea.  He wears a CPAP.  He has chronic edema related to venous insuff.   Prior CV studies:   The following studies were reviewed today:  Echo 2/16  - Left ventricle: The cavity size was normal. Wall thickness was normal. Systolic function was normal. The estimated ejection fraction was in the range of 60% to 65%. Wall motion was normal; there were no regional wall motion abnormalities. Doppler parameters are consistent with abnormal left ventricular relaxation (grade 1 diastolic dysfunction). - Left atrium: The atrium was mildly dilated. - Right atrium: The atrium was mildly dilated.  Past Medical History:  Diagnosis Date  . ALLERGIC RHINITIS, SEASONAL 01/25/2011  . BACK STRAIN, LUMBAR 01/25/2011  . Chronic pain syndrome   . Chronic radicular low back pain 01/25/2011   Qualifier: Diagnosis of  By: Charlett Blake MD, Stacey  1. Focal annular tear of the L4-5 disc at the level of the right  neural foramen with a tiny disc protrusion compressing the right L4  nerve in the neural foramen. This is more prominent than on the  prior study.  2. 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  Follows with [facility] Genevie [facility]  . COLONIC POLYPS, BENIGN 01/25/2011  . Cough 11/10/2013  . Degeneration of lumbar or  lumbosacral intervertebral disc   . Diabetes mellitus   . DIVERTICULOSIS OF COLON 01/25/2011  . DM 01/25/2011  . Hypocalcemia 03/04/2012  . 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 LAE (left atrial enlargement) 06/27/2013  . Mixed hyperlipidemia 01/25/2011  . Morbid obesity ([place]) 01/25/2011  . Pain in right axilla 11/10/2013  . Personal history of colonic polyps- sessile serrated adenoma 12/21/2006  . Preventative health care 03/04/2012  . RBBB 01/25/2011   Qualifier: Diagnosis of  By: Charlett Blake MD, Erline Levine    . Renal cyst 05/07/2016  . SLEEP APNEA 01/25/2011  . Tinea pedis 11/15/2014  . Undiagnosed cardiac murmurs 01/25/2011    Current Medications: Current Meds  Medication Sig  . aspirin EC 81 MG tablet Take 1 tablet (81 mg total) by mouth daily.  Marland Kitchen atorvastatin (LIPITOR) 40 MG tablet TAKE 1 TABLET DAILY  . Cholecalciferol (VITAMIN D-3 PO) Take 1,000 Int'l Units by mouth daily.   . cyclobenzaprine (FLEXERIL) 10 MG tablet Take 1 tablet (10 mg total) by mouth 2 (two) times daily as needed. For back pain  . fluticasone (FLONASE) 50 MCG/ACT nasal spray USE 2 SPRAYS IN EACH NOSTRIL DAILY  . glucose blood (BAYER CONTOUR NEXT TEST) test strip USE AS DIRECTED 4-6 TIMES A DAY TO CHECK BLOOD SUGAR  . HUMALOG 100 UNIT/ML injection INJECT VIA INSULIN PUMP PER SLIDING SCALE  .  HYDROcodone-acetaminophen (NORCO) 10-325 MG per tablet Take 1 tablet by mouth every 6 (six) hours as needed.   . INVOKANA 300 MG TABS tablet TAKE 1 TABLET DAILY BEFORE BREAKFAST  . losartan (COZAAR) 25 MG tablet TAKE 1 TABLET DAILY  . metFORMIN (GLUCOPHAGE) 1000 MG tablet TAKE 2 TABLETS DAILY  . metoprolol succinate (TOPROL-XL) 50 MG 24 hr tablet TAKE 1 TABLET DAILY  . Omega-3 Fatty Acids (FISH OIL PO) Take 500 mg by mouth 3 (three) times daily.      Allergies:   Pollen extract   Social History   Social History  . Marital status: Married    Spouse name: N/A  . Number of children: 4  . Years of  education: N/A   Occupational History  . retired    Social History Main Topics  . Smoking status: Former Smoker    Packs/day: 3.00    Years: 10.00    Quit date: 11/20/1990  . Smokeless tobacco: Never Used  . Alcohol use Yes     Comment: occasional  . Drug use: No  . Sexual activity: Yes   Other Topics Concern  . None   Social History Narrative  . None     ROS:   Please see the history of present illness.    ROS All other systems reviewed and are negative.   EKGs/Labs/Other Test Reviewed:    EKG:  EKG is  ordered today.  The ekg ordered today demonstrates NSR, HR 68, LAD, QTc 431 ms, no changes  Recent Labs: 04/26/2017: ALT 21; BUN 12; Creatinine, Ser 0.93; Hemoglobin 13.5; Platelets 134.0; Potassium 4.2; Sodium 141; TSH 1.18   Recent Lipid Panel Lab Results  Component Value Date/Time   CHOL 126 04/26/2017 09:24 AM   TRIG 137.0 04/26/2017 09:24 AM   HDL 37.30 (L) 04/26/2017 09:24 AM   CHOLHDL 3 04/26/2017 09:24 AM   LDLCALC 61 04/26/2017 09:24 AM   LDLDIRECT 71.0 04/28/2016 11:42 AM    Physical Exam:    VS:  BP 118/62   Pulse 68   Ht 6\' 1"  (1.854 m)   Wt (!) 323 lb 12.8 oz (146.9 kg)   BMI 42.72 kg/m     Wt Readings from Last 3 Encounters:  05/11/17 (!) 323 lb 12.8 oz (146.9 kg)  09/15/16 (!) 314 lb 8 oz (142.7 kg)  09/14/16 (!) 316 lb (143.3 kg)     Physical Exam  Constitutional: He is oriented to person, place, and time. He appears well-developed and well-nourished. No distress.  HENT:  Head: Normocephalic and atraumatic.  Neck: Normal range of motion. Carotid bruit is not present.  Cardiovascular: Normal rate, regular rhythm, S1 normal and S2 normal.   No murmur heard. Pulmonary/Chest: Effort normal. He has no rhonchi. He has no rales.  Abdominal: Soft.  Musculoskeletal: He exhibits edema (1-2+ bilat brawny edema).  Neurological: He is alert and oriented to person, place, and time.  Skin: Skin is warm and dry.  Psychiatric: He has a normal mood  and affect.    ASSESSMENT:    1. SVT (supraventricular tachycardia) ([place])   2. Essential hypertension    PLAN:    In order of problems listed above:  1. SVT (supraventricular tachycardia) ([facility]) -  Quiescent on beta-blocker therapy.  Continue Metoprolol succinate.    2. Essential hypertension - The patient's blood pressure is controlled on his current regimen.  Continue current therapy.    Dispo:  Return in about 1 year (around 05/11/2018) for Routine Follow Up,  w/ Dr. Acie Fredrickson.   Medication Adjustments/Labs and Tests Ordered: Current medicines are reviewed at length with the patient today.  Concerns regarding medicines are outlined above.  Orders/Tests:  Orders Placed This Encounter  Procedures  . EKG 12-Lead   Medication changes: No orders of the defined types were placed in this encounter.  Signed, Richardson Dopp, PA-C  05/11/2017 11:30 AM    [facility] [place], [place], [place]  [address] Phone: [phone]; Fax: [phone]

## 2017-05-14 ENCOUNTER — Other Ambulatory Visit: Payer: Self-pay

## 2017-05-14 ENCOUNTER — Encounter: Payer: Self-pay | Admitting: Endocrinology

## 2017-05-14 ENCOUNTER — Ambulatory Visit (INDEPENDENT_AMBULATORY_CARE_PROVIDER_SITE_OTHER): Payer: Medicare Other | Admitting: Endocrinology

## 2017-05-14 VITALS — BP 146/80 | HR 84 | Ht 73.0 in | Wt 323.0 lb

## 2017-05-14 DIAGNOSIS — I10 Essential (primary) hypertension: Secondary | ICD-10-CM

## 2017-05-14 DIAGNOSIS — Z794 Long term (current) use of insulin: Secondary | ICD-10-CM

## 2017-05-14 DIAGNOSIS — E782 Mixed hyperlipidemia: Secondary | ICD-10-CM

## 2017-05-14 DIAGNOSIS — E1165 Type 2 diabetes mellitus with hyperglycemia: Secondary | ICD-10-CM

## 2017-05-14 MED ORDER — GLUCOSE BLOOD VI STRP: ORAL_STRIP | 3 refills | Status: DC

## 2017-05-14 NOTE — Progress Notes (Signed)
Patient ID: Jake Ortiz, male   DOB: 14-May-1956, 61 y.o.   MRN: [id number]           Reason for Appointment: Follow-up for Type 2 Diabetes    History of Present Illness:          Diagnosis: Type 2 diabetes mellitus, date of diagnosis: 1996       Past history:  He was initially treated with metformin at diagnosis in about 5 years later he was started on insulin He had been on Levemir and possibly other insulin regimens before going on an insulin pump in 2005 Apparently was put on insulin pump because of his large insulin requirement and poor control Also has been continued on metformin He  had fairly good A1c results in the past year although previously ranging from 7-7.9; in 07/2014 Was 6.1 He has been on [facility]  since 3/16 which has helped his control , reduce insulin requirement and enable weight loss  Recent history:   He is on a Medtronic 723 insulin pump using a regimen of basal only with the pump and bolusing with Humalog using a syringe.  BASAL RATES: Midnight = 7.0, 4 AM = 7.5, 9 AM = 4.5, 1 PM = 4.5 and 10 PM = 6.0,   total basal rate about 136 units per day BOLUSES: Carbohydrate coverage 1:1-1:2 with Humalog with a syringe, correction 1:10 with target 90-110   He is again coming up to several months, previously seen in 10/17  His blood sugars have been previously well controlled with A1c upper normal but now it is staying over 6.5, previously 5.8  Current blood sugar patterns and management:  He says he is monitoring his blood sugars very frequently but does not use the same glucose monitor and does not always enter his blood sugars in the pump  His fasting blood sugars are fairly consistent with only a couple of high readings recently and no reported overnight hypoglycemia  Not clear why his OVERNIGHT basal rate is higher since his last visit, he may have changed this on his own  He is generally not watching his diet and traveling again and has not also  been trying to control portions  Difficult to know what he is taking for boluses as he is not doing this on his pump  Also because of difficulty getting refills on his infusion set he has been until recently reusing these infusion sites for longer periods of time  She things with this his blood sugars probably have been higher  Again he has gained more weight and is not exercising also  He is fairly regular with his Invokana and metformin   Hypoglycemia:  as above      Oral hypoglycemic drugs the patient is taking are: Metformin 1 g twice a day, Invokana 300 mg daily      Compliance with the medical regimen:  Variable recently    Glucose monitoring:  done 3-4 times a day         Glucometer: Contour    Blood Glucose readings by time of day and averages from pump download:  Mean values apply above for all meters except median for One Touch  PRE-MEAL Fasting Lunch Dinner Bedtime Overall  Glucose range: 89-161  57-169  129-165 94-137    Mean/median:     126   Self-care: The diet that the patient has been following is: tries to limit high-fat meals   Meals: 3 meals per day. Breakfast is  eggs and toast 9 am, has relatively more carbohydrates at lunch            Exercise: Minimal recently  Dietician visit, most recent: None recently .               Weight history:  Wt Readings from Last 3 Encounters:  05/14/17 (!) 323 lb (146.5 kg)  05/11/17 (!) 323 lb 12.8 oz (146.9 kg)  09/15/16 (!) 314 lb 8 oz (142.7 kg)    Glycemic control:     Lab Results  Component Value Date   HGBA1C 6.7 (H) 05/11/2017   HGBA1C 6.8 (H) 04/26/2017   HGBA1C 6.6 (H) 09/11/2016   Lab Results  Component Value Date   MICROALBUR <0.7 05/11/2017   LDLCALC 61 04/26/2017   CREATININE 0.84 05/11/2017    Other active problems: See review of systems   Allergies as of 05/14/2017      Reactions   Pollen Extract       Medication List       Accurate as of 05/14/17  9:36 PM. Always use your most recent  med list.          aspirin EC 81 MG tablet Take 1 tablet (81 mg total) by mouth daily.   atorvastatin 40 MG tablet Commonly known as:  LIPITOR TAKE 1 TABLET DAILY   cyclobenzaprine 10 MG tablet Commonly known as:  FLEXERIL Take 1 tablet (10 mg total) by mouth 2 (two) times daily as needed. For back pain   FISH OIL PO Take 500 mg by mouth 3 (three) times daily.   fluticasone 50 MCG/ACT nasal spray Commonly known as:  FLONASE USE 2 SPRAYS IN EACH NOSTRIL DAILY   glucose blood test strip Commonly known as:  BAYER CONTOUR NEXT TEST USE AS DIRECTED 4-6 TIMES A DAY TO CHECK BLOOD SUGAR   HUMALOG 100 UNIT/ML injection Generic drug:  insulin lispro INJECT VIA INSULIN PUMP PER SLIDING SCALE   HYDROcodone-acetaminophen 10-325 MG tablet Commonly known as:  NORCO Take 1 tablet by mouth every 6 (six) hours as needed.   INVOKANA 300 MG Tabs tablet Generic drug:  canagliflozin TAKE 1 TABLET DAILY BEFORE BREAKFAST   losartan 25 MG tablet Commonly known as:  COZAAR TAKE 1 TABLET DAILY   metFORMIN 1000 MG tablet Commonly known as:  GLUCOPHAGE TAKE 2 TABLETS DAILY   metoprolol succinate 50 MG 24 hr tablet Commonly known as:  TOPROL-XL TAKE 1 TABLET DAILY   VITAMIN D-3 PO Take 1,000 Int'l Units by mouth daily.       Allergies:  Allergies  Allergen Reactions  . Pollen Extract     Past Medical History:  Diagnosis Date  . ALLERGIC RHINITIS, SEASONAL 01/25/2011  . BACK STRAIN, LUMBAR 01/25/2011  . Chronic pain syndrome   . Chronic radicular low back pain 01/25/2011   Qualifier: Diagnosis of  By: Charlett Blake MD, Stacey  1. Focal annular tear of the L4-5 disc at the level of the right  neural foramen with a tiny disc protrusion compressing the right L4  nerve in the neural foramen. This is more prominent than on the  prior study.  2. 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  Follows with [facility] Gen[facility]ie [facility]  . COLONIC  POLYPS, BENIGN 01/25/2011  . Cough 11/10/2013  . Degeneration of lumbar or lumbosacral intervertebral disc   . Diabetes mellitus   . DIVERTICULOSIS OF COLON 01/25/2011  . DM 01/25/2011  .  Hypocalcemia 03/04/2012  . 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 LAE (left atrial enlargement) 06/27/2013  . Mixed hyperlipidemia 01/25/2011  . Morbid obesity ([place]) 01/25/2011  . Pain in right axilla 11/10/2013  . Personal history of colonic polyps- sessile serrated adenoma 12/21/2006  . Preventative health care 03/04/2012  . RBBB 01/25/2011   Qualifier: Diagnosis of  By: Charlett Blake MD, Erline Levine    . Renal cyst 05/07/2016  . SLEEP APNEA 01/25/2011  . Tinea pedis 11/15/2014  . Undiagnosed cardiac murmurs 01/25/2011    Past Surgical History:  Procedure Laterality Date  . CHOLECYSTECTOMY    . skin biopsy on abdomen     unknown results  . TONSILLECTOMY      Family History  Problem Relation Age of Onset  . Cancer Mother        metastatic breast ca  . Allergies Mother   . Diabetes Father   . Other Father        renal failure/ CHF  . Coronary artery disease Father        s/p MI  . Diabetes Sister        Type 1  . Coronary artery disease Sister        s/p stents  . Heart disease Sister   . Obesity Brother   . Other Brother        Sport and exercise psychologist  . Asthma Daughter   . Other Daughter        back disease  . Diabetes Daughter   . Coronary artery disease Maternal Grandmother   . Diabetes Brother   . Heart disease Brother        s/p CABG  . Diabetes Brother        diet and oral meds  . Hypertension Brother   . Kidney disease Daughter        in childhood  . Colon cancer Neg Hx   . Pancreatic cancer Neg Hx   . Stomach cancer Neg Hx     Social History:  reports that he quit smoking about 26 years ago. He has a 30.00 pack-year smoking history. He has never used smokeless tobacco. He reports that he drinks alcohol. He reports that he does not use drugs.    Review of Systems      Most recent eye exam was in 6/17, has background retinopathy        LIPIDS: Under control with Lipitor 40 mg.  Has history of high triglycerides  Although his LDL particle number was below 1000 he has relatively higher small LDL particles and small LDL size       Lab Results  Component Value Date   CHOL 126 04/26/2017   HDL 37.30 (L) 04/26/2017   LDLCALC 61 04/26/2017   LDLDIRECT 71.0 04/28/2016   TRIG 137.0 04/26/2017   CHOLHDL 3 04/26/2017          The blood pressure has been controlled with taking low dose losartan and metoprolol Not clear why blood pressure is higher today, was excellent last week with cardiologist  BP Readings from Last 3 Encounters:  05/14/17 (!) 146/80  05/11/17 118/62  09/15/16 138/72      LABS:  Lab on 05/11/2017  Component Date Value Ref Range Status  . Hgb A1c MFr Bld 05/11/2017 6.7* 4.6 - 6.5 % Final   Glycemic Control Guidelines for People with Diabetes:Non Diabetic:  <6%Goal of Therapy: <7%Additional Action Suggested:  >8%   . Sodium 05/11/2017 141  135 - 145 mEq/L Final  . Potassium 05/11/2017 4.0  3.5 - 5.1 mEq/L Final  . Chloride 05/11/2017 106  96 - 112 mEq/L Final  . CO2 05/11/2017 28  19 - 32 mEq/L Final  . Glucose, Bld 05/11/2017 93  70 - 99 mg/dL Final  . BUN 05/11/2017 11  6 - 23 mg/dL Final  . Creatinine, Ser 05/11/2017 0.84  0.40 - 1.50 mg/dL Final  . Total Bilirubin 05/11/2017 1.0  0.2 - 1.2 mg/dL Final  . Alkaline Phosphatase 05/11/2017 41  39 - 117 U/L Final  . AST 05/11/2017 25  0 - 37 U/L Final  . ALT 05/11/2017 21  0 - 53 U/L Final  . Total Protein 05/11/2017 6.2  6.0 - 8.3 g/dL Final  . Albumin 05/11/2017 4.0  3.5 - 5.2 g/dL Final  . Calcium 05/11/2017 9.2  8.4 - 10.5 mg/dL Final  . GFR 05/11/2017 98.15  >60.00 mL/min Final  . Microalb, Ur 05/11/2017 <0.7  0.0 - 1.9 mg/dL Final  . Creatinine,U 05/11/2017 58.0  mg/dL Final  . Microalb Creat Ratio 05/11/2017 1.2  0.0 - 30.0 mg/g Final    Physical  Examination:  BP (!) 146/80 (Cuff Size: Large)   Pulse 84   Ht 6\' 1"  (1.854 m)   Wt (!) 323 lb (146.5 kg)   SpO2 95%   BMI 42.61 kg/m       ASSESSMENT:  Diabetes type 2, uncontrolled with  obesity and insulin resistant See history of present illness for detailed discussion of his current management, blood sugar patterns and problems identified     His home blood sugars are recently fairly good although her A1c is still above 6.5, previously had been below 6 Not clear A1c is accurate  He has had issues with not watching his diet, not exercising, gaining weight Also a few weeks ago was having issues with getting refills on his infusion sets He is requiring overall 130 units in basal insulin and still taking mealtime insulin with injections because of limited capacity of his pump  He is a good candidate for GLP-1 drug and this was discussed again in detail as previously and he refuses because of the cost  HYPERLIPIDEMIA: His LDL particle number is below 1000 Has small LDL particles which may improve with weight loss and better diet   PLAN:  Discussed day-to-day management, ways to improve his control, insulin sensitivity, weight loss Needs to to record his blood sugars on paper  document that he is checking blood sugars 4 times a day This will help him qualify for the freestyle Libre system and discussed in detail how this would be used He does need to watch his portions, snacks and start regular exercise Discussed that we may need to reduce his basal rate if blood sugars start getting low  Hypertension: He will continue follow-up with PCP and cardiologist   Counseling time on subjects discussed above is over 50% of today's 25 minute visit   Felecia Stanfill 05/14/2017, 9:36 PM   Note: This office note was prepared with Estate agent. Any transcriptional errors that result from this process are unintentional.

## 2017-05-16 DIAGNOSIS — M7541 Impingement syndrome of right shoulder: Secondary | ICD-10-CM | POA: Diagnosis not present

## 2017-05-21 ENCOUNTER — Telehealth: Payer: Self-pay

## 2017-05-23 ENCOUNTER — Encounter: Payer: Self-pay | Admitting: Pulmonary Disease

## 2017-05-24 ENCOUNTER — Telehealth: Payer: Self-pay | Admitting: Adult Health

## 2017-05-24 ENCOUNTER — Ambulatory Visit (INDEPENDENT_AMBULATORY_CARE_PROVIDER_SITE_OTHER): Payer: Medicare Other | Admitting: Adult Health

## 2017-05-24 ENCOUNTER — Ambulatory Visit (INDEPENDENT_AMBULATORY_CARE_PROVIDER_SITE_OTHER): Payer: Medicare Other | Admitting: Family Medicine

## 2017-05-24 ENCOUNTER — Encounter: Payer: Self-pay | Admitting: Adult Health

## 2017-05-24 ENCOUNTER — Encounter: Payer: Self-pay | Admitting: Family Medicine

## 2017-05-24 VITALS — BP 126/70 | HR 73 | Temp 98.1°F | Resp 16 | Ht 73.0 in | Wt 322.0 lb

## 2017-05-24 DIAGNOSIS — G4733 Obstructive sleep apnea (adult) (pediatric): Secondary | ICD-10-CM | POA: Diagnosis not present

## 2017-05-24 DIAGNOSIS — Z0000 Encounter for general adult medical examination without abnormal findings: Secondary | ICD-10-CM

## 2017-05-24 DIAGNOSIS — N281 Cyst of kidney, acquired: Secondary | ICD-10-CM

## 2017-05-24 DIAGNOSIS — E782 Mixed hyperlipidemia: Secondary | ICD-10-CM | POA: Diagnosis not present

## 2017-05-24 DIAGNOSIS — R1011 Right upper quadrant pain: Secondary | ICD-10-CM

## 2017-05-24 DIAGNOSIS — E669 Obesity, unspecified: Secondary | ICD-10-CM

## 2017-05-24 DIAGNOSIS — I10 Essential (primary) hypertension: Secondary | ICD-10-CM

## 2017-05-24 DIAGNOSIS — E1169 Type 2 diabetes mellitus with other specified complication: Secondary | ICD-10-CM | POA: Diagnosis not present

## 2017-05-24 NOTE — Assessment & Plan Note (Signed)
Doing well on CPAP  Download requested.   Plan  Patient Instructions  Continue on CPAP At bedtime  .  Keep up good work  CPAP .Download requested.  Work on weight loss.  follow up Dr. Elsworth Soho  In 1 year and As needed

## 2017-05-24 NOTE — Assessment & Plan Note (Signed)
hgba1c acceptable, minimize simple carbs. Increase exercise as tolerated. Continue current meds, is doing well with insulin pump and has a doctor from [place] where he lives much of the year and they manage his diabetic supplies at this time. Next year he may be back here more full time

## 2017-05-24 NOTE — Assessment & Plan Note (Signed)
Stable and unchanging, not related to eating or activity. Ultrasound of abdomen shows persistent fatty liver properties

## 2017-05-24 NOTE — Assessment & Plan Note (Signed)
Well controlled, no changes to meds. Encouraged heart healthy diet such as the DASH diet and exercise as tolerated.  °

## 2017-05-24 NOTE — Telephone Encounter (Signed)
Pt brought SD card by for download per TP's request at [place] at [facility] office on 05/24/17. DL has been placed in TP's cubby for review. Nothing further needed.

## 2017-05-24 NOTE — Assessment & Plan Note (Signed)
Encouraged heart healthy diet, increase exercise, avoid trans fats, consider a krill oil cap daily 

## 2017-05-24 NOTE — Progress Notes (Signed)
Subjective:   Jake Ortiz is a 61 y.o. male who presents for Medicare Annual/Subsequent preventive examination.  Review of Systems:  No ROS.  Medicare Wellness Visit. Additional risk factors are reflected in the social history.  Cardiac Risk Factors include: advanced age (>96men, >60 women);diabetes mellitus;dyslipidemia;hypertension;male gender;obesity (BMI >30kg/m2);sedentary lifestyle Sleep patterns:  Wears CPAP. Sleeps 6 hrs. Feels rested. Wakes once to urinate. Home Safety/Smoke Alarms: Feels safe in home. Smoke alarms in place.  Living environment; residence and Firearm Safety: Lives with wife in motor home and 2 dogs. Denies any safety issues.  Seat Belt Safety/Bike Helmet: Wears seat belt.   Counseling:   Eye Exam- Wearing glasses. Eye doctor yearly.   Dental- Dr.Morris every 6 months.  Male:   CCS- Last 09/25/13:  Pre-cancerous polyp removed.  Recall 5-7 years. PSA-  Lab Results  Component Value Date   PSA 0.91 11/02/2015   PSA 0.95 05/07/2014       Objective:    Vitals: BP 126/70 (BP Location: Left Arm, Cuff Size: Large)   Pulse 73   Temp 98.1 F (36.7 C) (Oral)   Resp 16   Ht 6\' 1"  (1.854 m)   Wt (!) 322 lb (146.1 kg)   SpO2 96%   BMI 42.48 kg/m   Body mass index is 42.48 kg/m.  Tobacco History  Smoking Status  . Former Smoker  . Packs/day: 3.00  . Years: 10.00  . Quit date: 11/20/1990  Smokeless Tobacco  . Never Used     Counseling given: Not Answered   Past Medical History:  Diagnosis Date  . ALLERGIC RHINITIS, SEASONAL 01/25/2011  . BACK STRAIN, LUMBAR 01/25/2011  . Chronic pain syndrome   . Chronic radicular low back pain 01/25/2011   Qualifier: Diagnosis of  By: Charlett Blake MD, Stacey  1. Focal annular tear of the L4-5 disc at the level of the right  neural foramen with a tiny disc protrusion compressing the right L4  nerve in the neural foramen. This is more prominent than on the  prior study.  2. Chronic disc protrusion at L5-S1 central  and to the left with  no neural impingement.  Follows with Dr Genevie Ann [facility]  . COLONIC POLYPS, BENIGN 01/25/2011  . Cough 11/10/2013  . Degeneration of lumbar or lumbosacral intervertebral disc   . Diabetes mellitus   . DIVERTICULOSIS OF COLON 01/25/2011  . DM 01/25/2011  . Hypocalcemia 03/04/2012  . 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 LAE (left atrial enlargement) 06/27/2013  . Mixed hyperlipidemia 01/25/2011  . Morbid obesity ([place]) 01/25/2011  . Pain in right axilla 11/10/2013  . Personal history of colonic polyps- sessile serrated adenoma 12/21/2006  . Preventative health care 03/04/2012  . RBBB 01/25/2011   Qualifier: Diagnosis of  By: Charlett Blake MD, Erline Levine    . Renal cyst 05/07/2016  . SLEEP APNEA 01/25/2011  . Tinea pedis 11/15/2014  . Undiagnosed cardiac murmurs 01/25/2011   Past Surgical History:  Procedure Laterality Date  . CHOLECYSTECTOMY    . skin biopsy on abdomen     unknown results  . TONSILLECTOMY     Family History  Problem Relation Age of Onset  . Cancer Mother        metastatic breast ca  . Allergies Mother   . Diabetes Father   . Other Father        renal failure/ CHF  . Coronary artery disease Father  s/p MI  . Diabetes Sister        Type 1  . Coronary artery disease Sister        s/p stents  . Heart disease Sister   . Obesity Brother   . Other Brother        Sport and exercise psychologist  . Asthma Daughter   . Other Daughter        back disease  . Diabetes Daughter   . Coronary artery disease Maternal Grandmother   . Diabetes Brother   . Heart disease Brother        s/p CABG  . Diabetes Brother        diet and oral meds  . Hypertension Brother   . Kidney disease Daughter        in childhood  . Colon cancer Neg Hx   . Pancreatic cancer Neg Hx   . Stomach cancer Neg Hx    History  Sexual Activity  . Sexual activity: Yes    Outpatient Encounter Prescriptions as of 05/24/2017  Medication Sig  . aspirin EC 81  MG tablet Take 1 tablet (81 mg total) by mouth daily.  Marland Kitchen atorvastatin (LIPITOR) 40 MG tablet TAKE 1 TABLET DAILY  . Cholecalciferol (VITAMIN D-3 PO) Take 1,000 Int'l Units by mouth daily.   . cyclobenzaprine (FLEXERIL) 10 MG tablet Take 1 tablet (10 mg total) by mouth 2 (two) times daily as needed. For back pain  . fluticasone (FLONASE) 50 MCG/ACT nasal spray USE 2 SPRAYS IN EACH NOSTRIL DAILY  . glucose blood (BAYER CONTOUR NEXT TEST) test strip USE AS DIRECTED 4-6 TIMES A DAY TO CHECK BLOOD SUGAR  . HUMALOG 100 UNIT/ML injection INJECT VIA INSULIN PUMP PER SLIDING SCALE  . HYDROcodone-acetaminophen (NORCO) 10-325 MG per tablet Take 1 tablet by mouth every 6 (six) hours as needed.   . INVOKANA 300 MG TABS tablet TAKE 1 TABLET DAILY BEFORE BREAKFAST  . losartan (COZAAR) 25 MG tablet TAKE 1 TABLET DAILY  . metFORMIN (GLUCOPHAGE) 1000 MG tablet TAKE 2 TABLETS DAILY  . metoprolol succinate (TOPROL-XL) 50 MG 24 hr tablet TAKE 1 TABLET DAILY  . Omega-3 Fatty Acids (FISH OIL PO) Take 500 mg by mouth 3 (three) times daily.    No facility-administered encounter medications on file as of 05/24/2017.     Activities of Daily Living In your present state of health, do you have any difficulty performing the following activities: 05/24/2017  Hearing? Y  Vision? N  Difficulty concentrating or making decisions? N  Walking or climbing stairs? N  Dressing or bathing? N  Preparing Food and eating ? N  Using the Toilet? N  In the past six months, have you accidently leaked urine? N  Do you have problems with loss of bowel control? N  Managing your Medications? N  Managing your Finances? N  Housekeeping or managing your Housekeeping? N  Some recent data might be hidden    Patient Care Team: Mosie Lukes, MD as PCP - General (Family Medicine) Dalton-Bethea, Fabio Asa, MD (Physical Medicine and Rehabilitation) Elayne Snare, MD as Consulting Physician (Endocrinology) Nahser, Wonda Cheng, MD as Consulting  Physician (Cardiology) Juluis Rainier [facility])   Assessment:    Physical assessment deferred to PCP.  Exercise Activities and Dietary recommendations Current Exercise Habits: The patient does not participate in regular exercise at present, Exercise limited by: None identified   Diet (meal preparation, eat out, water intake, caffeinated beverages, dairy products, fruits and vegetables): in general, an "  unhealthy" diet      Goals    . Weight (lb) < 300 lb (136.1 kg)          With diet and exercise.      Fall Risk Fall Risk  05/24/2017  Falls in the past year? No   Depression Screen PHQ 2/9 Scores 05/24/2017  PHQ - 2 Score 0    Cognitive Function MMSE - Mini Mental State Exam 05/24/2017  Orientation to time 5  Orientation to Place 5  Registration 3  Attention/ Calculation 5  Recall 3  Language- name 2 objects 2  Language- repeat 1  Language- follow 3 step command 3  Language- read & follow direction 1  Write a sentence 1  Copy design 1  Total score 30        Immunization History  Administered Date(s) Administered  . Td 11/20/2008  . Zoster 05/14/2015   Screening Tests Health Maintenance  Topic Date Due  . OPHTHALMOLOGY EXAM  04/25/2017  . INFLUENZA VACCINE  10/08/2017 (Originally 06/20/2017)  . PNEUMOCOCCAL POLYSACCHARIDE VACCINE (1) 05/23/2022 (Originally 07/14/1956)  . HIV Screening  05/24/2028 (Originally 07/14/1969)  . FOOT EXAM  09/14/2017  . HEMOGLOBIN A1C  11/10/2017  . COLONOSCOPY  09/25/2018  . TETANUS/TDAP  11/20/2018  . Hepatitis C Screening  Completed      Plan:    Follow up with Dr.Blyth as directed.  Eat heart healthy diet (full of fruits, vegetables, whole grains, lean protein, water--limit salt, fat, and sugar intake) and increase physical activity as tolerated.  Continue doing brain stimulating activities (puzzles, reading, adult coloring books, staying active) to keep memory sharp.   I have personally reviewed and noted the following in  the patient's chart:   . Medical and social history . Use of alcohol, tobacco or illicit drugs  . Current medications and supplements . Functional ability and status . Nutritional status . Physical activity . Advanced directives . List of other physicians . Hospitalizations, surgeries, and ER visits in previous 12 months . Vitals . Screenings to include cognitive, depression, and falls . Referrals and appointments  In addition, I have reviewed and discussed with patient certain preventive protocols, quality metrics, and best practice recommendations. A written personalized care plan for preventive services as well as general preventive health recommendations were provided to patient.     Shela Nevin, [place]  05/24/2017

## 2017-05-24 NOTE — Assessment & Plan Note (Signed)
Has increased slightly to 8.3 cm from 8.0 and his left kidney has diminished in size some. He is encouraged to accept a urology consult but he is leaving town to travel the country and declines the referral he will not be back here for one year. He has established with a doctor in [place] so he will follow up there in 6 months.

## 2017-05-24 NOTE — Progress Notes (Signed)
Patient ID: Jake Ortiz, male   DOB: [birth year], 61 y.o.   MRN: [id number]     Subjective:  I acted as a scribe for Dr. Vertell Novak, CMA   Patient ID: Jake Ortiz, male    DOB: [birth year], 61 y.o.   MRN: [id number]  Chief Complaint  Patient presents with  . Hypertension  . Hyperlipidemia    HPI  Patient is in today for follow up blood pressure, cholesterol, and other tests that were ordered for the cyst on kidney and pain on right side.  All test were all good.  He feels well today. He currently resides most of the year in [place] so has not been in for some time. He has found a doctor in [place] who manages his diabetic supplies for now. He believes he will move back here full-time next year when his daughter has a child for now after today will not be back for a year. He continues to have some right upper quadrant pain but describes it as stable and not changed with activity or movement. He is not overly concerned with it and it does not limit his daily activities. He acknowledges he is not as active as he would like to be. Denies CP/palp/SOB/HA/congestion/fevers/GI or GU c/o. Taking meds as prescribed  Patient Care Team: Mosie Lukes, MD as PCP - General (Family Medicine) Dalton-Bethea, Fabio Asa, MD (Physical Medicine and Rehabilitation) Elayne Snare, MD as Consulting Physician (Endocrinology) Nahser, Wonda Cheng, MD as Consulting Physician (Cardiology) Idolina Primer [facility])   Past Medical History:  Diagnosis Date  . ALLERGIC RHINITIS, SEASONAL 01/25/2011  . BACK STRAIN, LUMBAR 01/25/2011  . Chronic pain syndrome   . Chronic radicular low back pain 01/25/2011   Qualifier: Diagnosis of  By: Charlett Blake MD, Joy Reiger  1. Focal annular tear of the L4-5 disc at the level of the right  neural foramen with a tiny disc protrusion compressing the right L4  nerve in the neural foramen. This is more prominent than on the  prior study.  2. Chronic disc protrusion at L5-S1 central and to  the left with  no neural impingement.  Follows with Dr Genevie Ann [facility]  . COLONIC POLYPS, BENIGN 01/25/2011  . Cough 11/10/2013  . Degeneration of lumbar or lumbosacral intervertebral disc   . Diabetes mellitus   . DIVERTICULOSIS OF COLON 01/25/2011  . DM 01/25/2011  . Hypocalcemia 03/04/2012  . 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 LAE (left atrial enlargement) 06/27/2013  . Mixed hyperlipidemia 01/25/2011  . Morbid obesity ([place]) 01/25/2011  . Pain in right axilla 11/10/2013  . Personal history of colonic polyps- sessile serrated adenoma 12/21/2006  . Preventative health care 03/04/2012  . RBBB 01/25/2011   Qualifier: Diagnosis of  By: Charlett Blake MD, Erline Levine    . Renal cyst 05/07/2016  . SLEEP APNEA 01/25/2011  . Tinea pedis 11/15/2014  . Undiagnosed cardiac murmurs 01/25/2011    Past Surgical History:  Procedure Laterality Date  . CHOLECYSTECTOMY    . skin biopsy on abdomen     unknown results  . TONSILLECTOMY      Family History  Problem Relation Age of Onset  . Cancer Mother        metastatic breast ca  . Allergies Mother   . Diabetes Father   . Other Father        renal failure/ CHF  . Coronary artery disease Father  s/p MI  . Diabetes Sister        Type 1  . Coronary artery disease Sister        s/p stents  . Heart disease Sister   . Obesity Brother   . Other Brother        Sport and exercise psychologist  . Asthma Daughter   . Other Daughter        back disease  . Diabetes Daughter   . Coronary artery disease Maternal Grandmother   . Diabetes Brother   . Heart disease Brother        s/p CABG  . Diabetes Brother        diet and oral meds  . Hypertension Brother   . Kidney disease Daughter        in childhood  . Colon cancer Neg Hx   . Pancreatic cancer Neg Hx   . Stomach cancer Neg Hx     Social History   Social History  . Marital status: Married    Spouse name: N/A  . Number of children: 4  . Years of education: N/A    Occupational History  . retired    Social History Main Topics  . Smoking status: Former Smoker    Packs/day: 3.00    Years: 10.00    Quit date: 11/20/1990  . Smokeless tobacco: Never Used  . Alcohol use Yes     Comment: occasional  . Drug use: No  . Sexual activity: Yes   Other Topics Concern  . Not on file   Social History Narrative  . No narrative on file    Outpatient Medications Prior to Visit  Medication Sig Dispense Refill  . aspirin EC 81 MG tablet Take 1 tablet (81 mg total) by mouth daily.    Marland Kitchen atorvastatin (LIPITOR) 40 MG tablet TAKE 1 TABLET DAILY 90 tablet 2  . Cholecalciferol (VITAMIN D-3 PO) Take 1,000 Int'l Units by mouth daily.     . cyclobenzaprine (FLEXERIL) 10 MG tablet Take 1 tablet (10 mg total) by mouth 2 (two) times daily as needed. For back pain 30 tablet 2  . fluticasone (FLONASE) 50 MCG/ACT nasal spray USE 2 SPRAYS IN EACH NOSTRIL DAILY 48 g 6  . glucose blood (BAYER CONTOUR NEXT TEST) test strip USE AS DIRECTED 4-6 TIMES A DAY TO CHECK BLOOD SUGAR 200 each 3  . HUMALOG 100 UNIT/ML injection INJECT VIA INSULIN PUMP PER SLIDING SCALE 250 mL 1  . HYDROcodone-acetaminophen (NORCO) 10-325 MG per tablet Take 1 tablet by mouth every 6 (six) hours as needed.     . INVOKANA 300 MG TABS tablet TAKE 1 TABLET DAILY BEFORE BREAKFAST 90 tablet 2  . losartan (COZAAR) 25 MG tablet TAKE 1 TABLET DAILY 90 tablet 2  . metFORMIN (GLUCOPHAGE) 1000 MG tablet TAKE 2 TABLETS DAILY 180 tablet 1  . metoprolol succinate (TOPROL-XL) 50 MG 24 hr tablet TAKE 1 TABLET DAILY 90 tablet 3  . Omega-3 Fatty Acids (FISH OIL PO) Take 500 mg by mouth 3 (three) times daily.      No facility-administered medications prior to visit.     Allergies  Allergen Reactions  . Pollen Extract     Review of Systems  Constitutional: Negative for fever and malaise/fatigue.  HENT: Negative for congestion.   Eyes: Negative for blurred vision.  Respiratory: Negative for cough and shortness of  breath.   Cardiovascular: Negative for chest pain, palpitations and leg swelling.  Gastrointestinal: Positive for abdominal pain. Negative for  vomiting.  Musculoskeletal: Negative for back pain.  Skin: Negative for rash.  Neurological: Negative for loss of consciousness and headaches.       Objective:    Physical Exam  Constitutional: He appears well-developed and well-nourished. No distress.  HENT:  Head: Normocephalic and atraumatic.  Eyes: Conjunctivae are normal.  Neck: Normal range of motion. No thyromegaly present.  Cardiovascular: Normal rate and regular rhythm.   Pulmonary/Chest: Effort normal. He has no wheezes.  Abdominal: Soft. Bowel sounds are normal. There is no tenderness.  Musculoskeletal: Normal range of motion. He exhibits no edema or deformity.  Neurological: He is alert.  Skin: Skin is warm and dry. He is not diaphoretic.  Psychiatric: He has a normal mood and affect.    BP 126/70 (BP Location: Left Arm, Cuff Size: Large)   Pulse 73   Temp 98.1 F (36.7 C) (Oral)   Resp 16   Ht 6\' 1"  (1.854 m)   Wt (!) 322 lb (146.1 kg)   SpO2 96%   BMI 42.48 kg/m  Wt Readings from Last 3 Encounters:  05/24/17 (!) 322 lb (146.1 kg)  05/14/17 (!) 323 lb (146.5 kg)  05/11/17 (!) 323 lb 12.8 oz (146.9 kg)   BP Readings from Last 3 Encounters:  05/24/17 126/70  05/14/17 (!) 146/80  05/11/17 118/62     Immunization History  Administered Date(s) Administered  . Td 11/20/2008  . Zoster 05/14/2015    Health Maintenance  Topic Date Due  . OPHTHALMOLOGY EXAM  04/25/2017  . INFLUENZA VACCINE  10/08/2017 (Originally 06/20/2017)  . PNEUMOCOCCAL POLYSACCHARIDE VACCINE (1) 05/23/2022 (Originally 07/14/1956)  . HIV Screening  05/24/2028 (Originally 07/14/1969)  . FOOT EXAM  09/14/2017  . HEMOGLOBIN A1C  11/10/2017  . COLONOSCOPY  09/25/2018  . TETANUS/TDAP  11/20/2018  . Hepatitis C Screening  Completed    Lab Results  Component Value Date   WBC 6.1 04/26/2017   HGB  13.5 04/26/2017   HCT 43.0 04/26/2017   PLT 134.0 (L) 04/26/2017   GLUCOSE 93 05/11/2017   CHOL 126 04/26/2017   TRIG 137.0 04/26/2017   HDL 37.30 (L) 04/26/2017   LDLDIRECT 71.0 04/28/2016   LDLCALC 61 04/26/2017   ALT 21 05/11/2017   AST 25 05/11/2017   NA 141 05/11/2017   K 4.0 05/11/2017   CL 106 05/11/2017   CREATININE 0.84 05/11/2017   BUN 11 05/11/2017   CO2 28 05/11/2017   TSH 1.18 04/26/2017   PSA 0.91 11/02/2015   HGBA1C 6.7 (H) 05/11/2017   MICROALBUR <0.7 05/11/2017    Lab Results  Component Value Date   TSH 1.18 04/26/2017   Lab Results  Component Value Date   WBC 6.1 04/26/2017   HGB 13.5 04/26/2017   HCT 43.0 04/26/2017   MCV 68.2 Repeated and verified X2. (L) 04/26/2017   PLT 134.0 (L) 04/26/2017   Lab Results  Component Value Date   NA 141 05/11/2017   K 4.0 05/11/2017   CO2 28 05/11/2017   GLUCOSE 93 05/11/2017   BUN 11 05/11/2017   CREATININE 0.84 05/11/2017   BILITOT 1.0 05/11/2017   ALKPHOS 41 05/11/2017   AST 25 05/11/2017   ALT 21 05/11/2017   PROT 6.2 05/11/2017   ALBUMIN 4.0 05/11/2017   CALCIUM 9.2 05/11/2017   GFR 98.15 05/11/2017   Lab Results  Component Value Date   CHOL 126 04/26/2017   Lab Results  Component Value Date   HDL 37.30 (L) 04/26/2017   Lab Results  Component Value Date   LDLCALC 61 04/26/2017   Lab Results  Component Value Date   TRIG 137.0 04/26/2017   Lab Results  Component Value Date   CHOLHDL 3 04/26/2017   Lab Results  Component Value Date   HGBA1C 6.7 (H) 05/11/2017         Assessment & Plan:   Problem List Items Addressed This Visit    Diabetes mellitus type 2 in obese ([place]) (Chronic)    hgba1c acceptable, minimize simple carbs. Increase exercise as tolerated. Continue current meds, is doing well with insulin pump and has a doctor from [place] where he lives much of the year and they manage his diabetic supplies at this time. Next year he may be back here more full time      Mixed  hyperlipidemia    Encouraged heart healthy diet, increase exercise, avoid trans fats, consider a krill oil cap daily      Morbid obesity ([place])    Encouraged DASH diet, decrease po intake and increase exercise as tolerated. Needs 7-8 hours of sleep nightly. Avoid trans fats, eat small, frequent meals every 4-5 hours with lean proteins, complex carbs and healthy fats. Minimize simple carbs,  Bariatric consult today      HTN (hypertension)    Well controlled, no changes to meds. Encouraged heart healthy diet such as the DASH diet and exercise as tolerated.       RUQ pain    Stable and unchanging, not related to eating or activity. Ultrasound of abdomen shows persistent fatty liver properties      Renal cyst    Has increased slightly to 8.3 cm from 8.0 and his left kidney has diminished in size some. He is encouraged to accept a urology consult but he is leaving town to travel the country and declines the referral he will not be back here for one year. He has established with a doctor in [place] so he will follow up there in 6 months.         I am having [name] maintain his HYDROcodone-acetaminophen, Omega-3 Fatty Acids (FISH OIL PO), Cholecalciferol (VITAMIN D-3 PO), cyclobenzaprine, aspirin EC, fluticasone, metoprolol succinate, INVOKANA, atorvastatin, HUMALOG, metFORMIN, losartan, and glucose blood.  No orders of the defined types were placed in this encounter.   CMA served as Education administrator during this visit. History, Physical and Plan performed by medical provider. Documentation and orders reviewed and attested to.  Penni Homans, MD

## 2017-05-24 NOTE — Assessment & Plan Note (Signed)
Wt loss  

## 2017-05-24 NOTE — Assessment & Plan Note (Signed)
Encouraged DASH diet, decrease po intake and increase exercise as tolerated. Needs 7-8 hours of sleep nightly. Avoid trans fats, eat small, frequent meals every 4-5 hours with lean proteins, complex carbs and healthy fats. Minimize simple carbs,  Bariatric consult today

## 2017-05-24 NOTE — Patient Instructions (Signed)
Continue on CPAP At bedtime  .  Keep up good work  CPAP .Download requested.  Work on weight loss.  follow up Dr. Elsworth Soho  In 1 year and As needed

## 2017-05-24 NOTE — Progress Notes (Signed)
@Patient  ID: Jake Ortiz, male    DOB: October 30, 1956, 61 y.o.   MRN: [id number]  Chief Complaint  Patient presents with  . Follow-up    OSA     Referring provider: Mosie Lukes, MD  HPI: 61 yo male followed for OSA  DM   TEST  PSG in 2006 [facility] [place] [facility]) showed AHI 25/h , desatn to 85% corrected by CPAP 12 cm with med FF mask   05/24/2017 Follow up : OSA  Pt returns for 1 year follow up for moderate OSA . He says he is doing very well on CPAP . Wears each night for ~6 hr  . Denies any significant daytime sleepiness. Feels rested.  We discussed wt loss and healthy sleep regimen.   Unable to get download today , he will take SD card to DME office for download today .  Travels a lot in [place].   Allergies  Allergen Reactions  . Pollen Extract     Immunization History  Administered Date(s) Administered  . Td 11/20/2008  . Zoster 05/14/2015    Past Medical History:  Diagnosis Date  . ALLERGIC RHINITIS, SEASONAL 01/25/2011  . BACK STRAIN, LUMBAR 01/25/2011  . Chronic pain syndrome   . Chronic radicular low back pain 01/25/2011   Qualifier: Diagnosis of  By: Charlett Blake MD, Stacey  1. Focal annular tear of the L4-5 disc at the level of the right  neural foramen with a tiny disc protrusion compressing the right L4  nerve in the neural foramen. This is more prominent than on the  prior study.  2. 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  Follows with Dr Genevie Ann [facility]  . COLONIC POLYPS, BENIGN 01/25/2011  . Cough 11/10/2013  . Degeneration of lumbar or lumbosacral intervertebral disc   . Diabetes mellitus   . DIVERTICULOSIS OF COLON 01/25/2011  . DM 01/25/2011  . Hypocalcemia 03/04/2012  . 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 LAE (left atrial enlargement) 06/27/2013  . Mixed hyperlipidemia 01/25/2011  . Morbid obesity ([place]) 01/25/2011  . Pain in right axilla 11/10/2013  . Personal history  of colonic polyps- sessile serrated adenoma 12/21/2006  . Preventative health care 03/04/2012  . RBBB 01/25/2011   Qualifier: Diagnosis of  By: Charlett Blake MD, Erline Levine    . Renal cyst 05/07/2016  . SLEEP APNEA 01/25/2011  . Tinea pedis 11/15/2014  . Undiagnosed cardiac murmurs 01/25/2011    Tobacco History: History  Smoking Status  . Former Smoker  . Packs/day: 3.00  . Years: 10.00  . Quit date: 11/20/1990  Smokeless Tobacco  . Never Used   Counseling given: Not Answered   Outpatient Encounter Prescriptions as of 05/24/2017  Medication Sig  . aspirin EC 81 MG tablet Take 1 tablet (81 mg total) by mouth daily.  Marland Kitchen atorvastatin (LIPITOR) 40 MG tablet TAKE 1 TABLET DAILY  . Cholecalciferol (VITAMIN D-3 PO) Take 1,000 Int'l Units by mouth daily.   . cyclobenzaprine (FLEXERIL) 10 MG tablet Take 1 tablet (10 mg total) by mouth 2 (two) times daily as needed. For back pain  . fluticasone (FLONASE) 50 MCG/ACT nasal spray USE 2 SPRAYS IN EACH NOSTRIL DAILY  . glucose blood (BAYER CONTOUR NEXT TEST) test strip USE AS DIRECTED 4-6 TIMES A DAY TO CHECK BLOOD SUGAR  . HUMALOG 100 UNIT/ML injection INJECT VIA INSULIN PUMP PER SLIDING SCALE  . HYDROcodone-acetaminophen (NORCO) 10-325 MG per  tablet Take 1 tablet by mouth every 6 (six) hours as needed.   . INVOKANA 300 MG TABS tablet TAKE 1 TABLET DAILY BEFORE BREAKFAST  . losartan (COZAAR) 25 MG tablet TAKE 1 TABLET DAILY  . metFORMIN (GLUCOPHAGE) 1000 MG tablet TAKE 2 TABLETS DAILY  . metoprolol succinate (TOPROL-XL) 50 MG 24 hr tablet TAKE 1 TABLET DAILY  . Omega-3 Fatty Acids (FISH OIL PO) Take 500 mg by mouth 3 (three) times daily.    No facility-administered encounter medications on file as of 05/24/2017.      Review of Systems  Constitutional:   No  weight loss, night sweats,  Fevers, chills, fatigue, or  lassitude.  HEENT:   No headaches,  Difficulty swallowing,  Tooth/dental problems, or  Sore throat,                No sneezing, itching, ear ache,  nasal congestion, post nasal drip,   CV:  No chest pain,  Orthopnea, PND, swelling in lower extremities, anasarca, dizziness, palpitations, syncope.   GI  No heartburn, indigestion, abdominal pain, nausea, vomiting, diarrhea, change in bowel habits, loss of appetite, bloody stools.   Resp: No shortness of breath with exertion or at rest.  No excess mucus, no productive cough,  No non-productive cough,  No coughing up of blood.  No change in color of mucus.  No wheezing.  No chest wall deformity  Skin: no rash or lesions.  GU: no dysuria, change in color of urine, no urgency or frequency.  No flank pain, no hematuria   MS:  No joint pain or swelling.  No decreased range of motion.  No back pain.    Physical Exam  BP 117/73 (BP Location: Left Arm, Patient Position: Sitting, Cuff Size: Normal)   Pulse 67   Ht 6\' 1"  (1.854 m)   Wt (!) 321 lb (145.6 kg)   SpO2 95%   BMI 42.35 kg/m   GEN: A/Ox3; pleasant , NAD, obese    HEENT:  [place]/AT,  EACs-clear, TMs-wnl, NOSE-clear, THROAT-clear, no lesions, no postnasal drip or exudate noted. Class 3-4 MP airway   NECK:  Supple w/ fair ROM; no JVD; normal carotid impulses w/o bruits; no thyromegaly or nodules palpated; no lymphadenopathy.    RESP  Clear  P & A; w/o, wheezes/ rales/ or rhonchi. no accessory muscle use, no dullness to percussion  CARD:  RRR, no m/r/g, no peripheral edema, pulses intact, no cyanosis or clubbing.  GI:   Soft & nt; nml bowel sounds; no organomegaly or masses detected.   Musco: Warm bil, no deformities or joint swelling noted.   Neuro: alert, no focal deficits noted.    Skin: Warm, no lesions or rashes     Lab Results:  CBC BMET   BNP No results found for: BNP  ProBNP No results found for: PROBNP  Imaging:    Assessment & Plan:   Obstructive sleep apnea Doing well on CPAP  Download requested.   Plan  Patient Instructions  Continue on CPAP At bedtime  .  Keep up good work  CPAP .Download  requested.  Work on weight loss.  follow up Dr. Elsworth Soho  In 1 year and As needed      Morbid obesity Wt loss      Rexene Edison, NP 05/24/2017

## 2017-05-24 NOTE — Patient Instructions (Signed)
DASH or MIND diet Carbohydrate Counting for Diabetes Mellitus, Adult Carbohydrate counting is a method for keeping track of how many carbohydrates you eat. Eating carbohydrates naturally increases the amount of sugar (glucose) in the blood. Counting how many carbohydrates you eat helps keep your blood glucose within normal limits, which helps you manage your diabetes (diabetes mellitus). It is important to know how many carbohydrates you can safely have in each meal. This is different for every person. A diet and nutrition specialist (registered dietitian) can help you make a meal plan and calculate how many carbohydrates you should have at each meal and snack. Carbohydrates are found in the following foods:  Grains, such as breads and cereals.  Dried beans and soy products.  Starchy vegetables, such as potatoes, peas, and corn.  Fruit and fruit juices.  Milk and yogurt.  Sweets and snack foods, such as cake, cookies, candy, chips, and soft drinks.  How do I count carbohydrates? There are two ways to count carbohydrates in food. You can use either of the methods or a combination of both. Reading "Nutrition Facts" on packaged food The "Nutrition Facts" list is included on the labels of almost all packaged foods and beverages in the [place] It includes:  The serving size.  Information about nutrients in each serving, including the grams (g) of carbohydrate per serving.  To use the "Nutrition Facts":  Decide how many servings you will have.  Multiply the number of servings by the number of carbohydrates per serving.  The resulting number is the total amount of carbohydrates that you will be having.  Learning standard serving sizes of other foods When you eat foods containing carbohydrates that are not packaged or do not include "Nutrition Facts" on the label, you need to measure the servings in order to count the amount of carbohydrates:  Measure the foods that you will eat with a  food scale or measuring cup, if needed.  Decide how many standard-size servings you will eat.  Multiply the number of servings by 15. Most carbohydrate-rich foods have about 15 g of carbohydrates per serving. ? For example, if you eat 8 oz (170 g) of strawberries, you will have eaten 2 servings and 30 g of carbohydrates (2 servings x 15 g = 30 g).  For foods that have more than one food mixed, such as soups and casseroles, you must count the carbohydrates in each food that is included.  The following list contains standard serving sizes of common carbohydrate-rich foods. Each of these servings has about 15 g of carbohydrates:   hamburger bun or  English muffin.   oz (15 mL) syrup.   oz (14 g) jelly.  1 slice of bread.  1 six-inch tortilla.  3 oz (85 g) cooked rice or pasta.  4 oz (113 g) cooked dried beans.  4 oz (113 g) starchy vegetable, such as peas, corn, or potatoes.  4 oz (113 g) hot cereal.  4 oz (113 g) mashed potatoes or  of a large baked potato.  4 oz (113 g) canned or frozen fruit.  4 oz (120 mL) fruit juice.  4-6 crackers.  6 chicken nuggets.  6 oz (170 g) unsweetened dry cereal.  6 oz (170 g) plain fat-free yogurt or yogurt sweetened with artificial sweeteners.  8 oz (240 mL) milk.  8 oz (170 g) fresh fruit or one small piece of fruit.  24 oz (680 g) popped popcorn.  Example of carbohydrate counting Sample meal  3 oz (  85 g) chicken breast.  6 oz (170 g) brown rice.  4 oz (113 g) corn.  8 oz (240 mL) milk.  8 oz (170 g) strawberries with sugar-free whipped topping. Carbohydrate calculation 1. Identify the foods that contain carbohydrates: ? Rice. ? Corn. ? Milk. ? Strawberries. 2. Calculate how many servings you have of each food: ? 2 servings rice. ? 1 serving corn. ? 1 serving milk. ? 1 serving strawberries. 3. Multiply each number of servings by 15 g: ? 2 servings rice x 15 g = 30 g. ? 1 serving corn x 15 g = 15 g. ? 1  serving milk x 15 g = 15 g. ? 1 serving strawberries x 15 g = 15 g. 4. Add together all of the amounts to find the total grams of carbohydrates eaten: ? 30 g + 15 g + 15 g + 15 g = 75 g of carbohydrates total. This information is not intended to replace advice given to you by your health care provider. Make sure you discuss any questions you have with your health care provider. Document Released: 11/06/2005 Document Revised: 05/26/2016 Document Reviewed: 04/19/2016 Elsevier Interactive Patient Education  [employer].

## 2017-05-27 NOTE — Progress Notes (Signed)
Reviewed & agree with plan  

## 2017-06-08 ENCOUNTER — Encounter: Payer: Self-pay | Admitting: Endocrinology

## 2017-06-28 ENCOUNTER — Telehealth: Payer: Self-pay | Admitting: Adult Health

## 2017-06-28 NOTE — Telephone Encounter (Signed)
Patient returned call Discussed CPAP download results as stated by TP Pt voiced his understanding and denied any questions/concerns  Nothing further needed, will sign off

## 2017-06-28 NOTE — Telephone Encounter (Signed)
Download as requested at the 7.5.18 ov w/ TP Per TP: good control, no changes in therapy.  LMOM TCB x1

## 2017-07-12 ENCOUNTER — Other Ambulatory Visit: Payer: Self-pay | Admitting: Endocrinology

## 2017-08-23 ENCOUNTER — Other Ambulatory Visit: Payer: Self-pay | Admitting: Endocrinology

## 2017-09-10 ENCOUNTER — Other Ambulatory Visit (INDEPENDENT_AMBULATORY_CARE_PROVIDER_SITE_OTHER): Payer: Medicare Other

## 2017-09-10 DIAGNOSIS — Z794 Long term (current) use of insulin: Secondary | ICD-10-CM | POA: Diagnosis not present

## 2017-09-10 DIAGNOSIS — E1165 Type 2 diabetes mellitus with hyperglycemia: Secondary | ICD-10-CM

## 2017-09-10 LAB — HEMOGLOBIN A1C: Hgb A1c MFr Bld: 6.9 % — ABNORMAL HIGH (ref 4.6–6.5)

## 2017-09-10 LAB — BASIC METABOLIC PANEL
BUN: 12 mg/dL (ref 6–23)
CALCIUM: 9.2 mg/dL (ref 8.4–10.5)
CO2: 27 meq/L (ref 19–32)
CREATININE: 0.73 mg/dL (ref 0.40–1.50)
Chloride: 105 mEq/L (ref 96–112)
GFR: 115.28 mL/min (ref >60.00)
GLUCOSE: 91 mg/dL (ref 70–99)
Potassium: 4.1 mEq/L (ref 3.5–5.1)
SODIUM: 140 meq/L (ref 135–145)

## 2017-09-11 ENCOUNTER — Telehealth: Payer: Self-pay | Admitting: Endocrinology

## 2017-09-11 NOTE — Telephone Encounter (Signed)
[facility] called they need to know if the patient was fasting need date and time. And recent progress notes for the last two recent  Office visits,  Call [place] [phone]

## 2017-09-11 NOTE — Telephone Encounter (Signed)
Do you know if the patient was fasting for labs upon his last 2 office visits?  Please advise.

## 2017-09-12 NOTE — Telephone Encounter (Signed)
Patient will know better

## 2017-09-12 NOTE — Telephone Encounter (Signed)
Called patient and he stated that he was fasting for his labs, he stated that he usually always does.

## 2017-09-13 ENCOUNTER — Encounter: Payer: Self-pay | Admitting: Endocrinology

## 2017-09-13 ENCOUNTER — Ambulatory Visit (INDEPENDENT_AMBULATORY_CARE_PROVIDER_SITE_OTHER): Payer: Medicare Other | Admitting: Endocrinology

## 2017-09-13 VITALS — BP 140/76 | HR 72 | Ht 73.0 in | Wt 321.4 lb

## 2017-09-13 DIAGNOSIS — E1165 Type 2 diabetes mellitus with hyperglycemia: Secondary | ICD-10-CM | POA: Diagnosis not present

## 2017-09-13 DIAGNOSIS — Z794 Long term (current) use of insulin: Secondary | ICD-10-CM

## 2017-09-13 DIAGNOSIS — E1142 Type 2 diabetes mellitus with diabetic polyneuropathy: Secondary | ICD-10-CM

## 2017-09-13 MED ORDER — GLUCOSE BLOOD VI STRP: ORAL_STRIP | 3 refills | Status: DC

## 2017-09-13 MED ORDER — GLUCOSE BLOOD VI STRP: ORAL_STRIP | 3 refills | Status: AC

## 2017-09-13 NOTE — Progress Notes (Signed)
Patient ID: Jake Ortiz, male   DOB: 12-03-1955, 61 y.o.   MRN: [id number]           Reason for Appointment: Follow-up for Type 2 Diabetes    History of Present Illness:          Diagnosis: Type 2 diabetes mellitus, date of diagnosis: 1996       Past history:  He was initially treated with metformin at diagnosis in about 5 years later he was started on insulin He had been on Levemir and possibly other insulin regimens before going on an insulin pump in 2005 Apparently was put on insulin pump because of his large insulin requirement and poor control Also has been continued on metformin He  had fairly good A1c results in the past year although previously ranging from 7-7.9; in 07/2014 Was 6.1 He has been on [facility]  since 3/16 which has helped his control , reduce insulin requirement and enable weight loss  Recent history:   He is on a Medtronic 723 insulin pump using a regimen of basal only with the pump and bolusing with Humalog using a syringe.  BASAL RATES: Midnight = 7.0, 4 AM = 7.5, 9 AM = 4.5, 1 PM = 4.5 and 10 PM = 6.0,   Total basal rate about 136 units per day  BOLUSES: Carbohydrate coverage 1:1-1:2 with Humalog with a syringe, correction 1:10 with target 90-110   His A1c has been below 7% consistently and now 6.9  Current blood sugar patterns and management:  He says he is monitoring his blood sugars much less than before because of not being able to get his test strips filled when he is traveling  Review of his recent download indicates only a few readings and mostly in the morning  Blood sugars maybe higher in the morning if he has snacks or sweets the night before  Previously had been recommended the freestyle Libre sensor but he has not checked his sugars enough to qualify this  His fasting blood sugars are sometimes normal and not enough readings to get a pattern today  Also no readings after meals  He does not walk regularly, also has some knee  pain  He is fairly regular with his Invokana and metformin  As before he takes most of his boluses manually although occasionally will partly use his pump for bolus  Basal rate settings are the same as last time   Hypoglycemia:  as above      Oral hypoglycemic drugs the patient is taking are: Metformin 1 g twice a day, Invokana 300 mg daily      Compliance with the medical regimen:  Variable recently    Glucose monitoring:  done 3-4 times a day         Glucometer: Contour    Blood Glucose readings by time of day and averages from pump download:   Mean values apply above for all meters except median for One Touch  PRE-MEAL Fasting Lunch Dinner Bedtime Overall  Glucose range: 96-236  08, 139      Mean/median:        POST-MEAL PC Breakfast PC Lunch PC Dinner  Glucose range:  169    Mean/median:       Self-care: The diet that the patient has been following is: tries to limit high-fat meals   Meals: 3 meals per day. Breakfast is eggs and toast 9 am, has relatively more carbohydrates at lunch  Exercise: Only some walking   recently  Dietician visit, most recent: None recently .               Weight history:  Wt Readings from Last 3 Encounters:  09/13/17 (!) 321 lb 6.4 oz (145.8 kg)  05/24/17 (!) 321 lb (145.6 kg)  05/24/17 (!) 322 lb (146.1 kg)    Glycemic control:     Lab Results  Component Value Date   HGBA1C 6.9 (H) 09/10/2017   HGBA1C 6.7 (H) 05/11/2017   HGBA1C 6.8 (H) 04/26/2017   Lab Results  Component Value Date   MICROALBUR <0.7 05/11/2017   LDLCALC 61 04/26/2017   CREATININE 0.73 09/10/2017    Other active problems: See review of systems   Allergies as of 09/13/2017      Reactions   Pollen Extract       Medication List       Accurate as of 09/13/17 11:19 AM. Always use your most recent med list.          aspirin EC 81 MG tablet Take 1 tablet (81 mg total) by mouth daily.   atorvastatin 40 MG tablet Commonly known as:   LIPITOR TAKE 1 TABLET DAILY   cyclobenzaprine 10 MG tablet Commonly known as:  FLEXERIL Take 1 tablet (10 mg total) by mouth 2 (two) times daily as needed. For back pain   FISH OIL PO Take 500 mg by mouth 3 (three) times daily.   fluticasone 50 MCG/ACT nasal spray Commonly known as:  FLONASE USE 2 SPRAYS IN EACH NOSTRIL DAILY   glucose blood test strip Commonly known as:  BAYER CONTOUR NEXT TEST USE AS DIRECTED 4-6 TIMES A DAY TO CHECK BLOOD SUGAR   glucose blood test strip Commonly known as:  BAYER CONTOUR NEXT TEST USE AS DIRECTED 4-6 TIMES A DAY TO CHECK BLOOD SUGAR   glucose blood test strip Commonly known as:  BAYER CONTOUR NEXT TEST USE AS DIRECTED 4-6 TIMES A DAY TO CHECK BLOOD SUGAR   HUMALOG 100 UNIT/ML injection Generic drug:  insulin lispro INJECT VIA INSULIN PUMP PER SLIDING SCALE   HYDROcodone-acetaminophen 10-325 MG tablet Commonly known as:  NORCO Take 1 tablet by mouth every 6 (six) hours as needed.   INVOKANA 300 MG Tabs tablet Generic drug:  canagliflozin TAKE 1 TABLET DAILY BEFORE BREAKFAST   losartan 25 MG tablet Commonly known as:  COZAAR TAKE 1 TABLET DAILY   metFORMIN 1000 MG tablet Commonly known as:  GLUCOPHAGE TAKE 2 TABLETS DAILY   metoprolol succinate 50 MG 24 hr tablet Commonly known as:  TOPROL-XL TAKE 1 TABLET DAILY   VITAMIN D-3 PO Take 1,000 Int'l Units by mouth daily.       Allergies:  Allergies  Allergen Reactions  . Pollen Extract     Past Medical History:  Diagnosis Date  . ALLERGIC RHINITIS, SEASONAL 01/25/2011  . BACK STRAIN, LUMBAR 01/25/2011  . Chronic pain syndrome   . Chronic radicular low back pain 01/25/2011   Qualifier: Diagnosis of  By: Charlett Blake MD, Stacey  1. Focal annular tear of the L4-5 disc at the level of the right  neural foramen with a tiny disc protrusion compressing the right L4  nerve in the neural foramen. This is more prominent than on the  prior study.  2. Chronic disc protrusion at L5-S1 central  and to the left with  no neural impingement.  Follows with Dr Genevie Ann [facility]  . COLONIC POLYPS, BENIGN 01/25/2011  .  Cough 11/10/2013  . Degeneration of lumbar or lumbosacral intervertebral disc   . Diabetes mellitus   . DIVERTICULOSIS OF COLON 01/25/2011  . DM 01/25/2011  . Hypocalcemia 03/04/2012  . 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 LAE (left atrial enlargement) 06/27/2013  . Mixed hyperlipidemia 01/25/2011  . Morbid obesity ([place]) 01/25/2011  . Pain in right axilla 11/10/2013  . Personal history of colonic polyps- sessile serrated adenoma 12/21/2006  . Preventative health care 03/04/2012  . RBBB 01/25/2011   Qualifier: Diagnosis of  By: Charlett Blake MD, Erline Levine    . Renal cyst 05/07/2016  . SLEEP APNEA 01/25/2011  . Tinea pedis 11/15/2014  . Undiagnosed cardiac murmurs 01/25/2011    Past Surgical History:  Procedure Laterality Date  . CHOLECYSTECTOMY    . skin biopsy on abdomen     unknown results  . TONSILLECTOMY      Family History  Problem Relation Age of Onset  . Cancer Mother        metastatic breast ca  . Allergies Mother   . Diabetes Father   . Other Father        renal failure/ CHF  . Coronary artery disease Father        s/p MI  . Diabetes Sister        Type 1  . Coronary artery disease Sister        s/p stents  . Heart disease Sister   . Obesity Brother   . Other Brother        Sport and exercise psychologist  . Asthma Daughter   . Other Daughter        back disease  . Diabetes Daughter   . Coronary artery disease Maternal Grandmother   . Diabetes Brother   . Heart disease Brother        s/p CABG  . Diabetes Brother        diet and oral meds  . Hypertension Brother   . Kidney disease Daughter        in childhood  . Colon cancer Neg Hx   . Pancreatic cancer Neg Hx   . Stomach cancer Neg Hx     Social History:  reports that he quit smoking about 26 years ago. He has a 30.00 pack-year smoking history. He has never used smokeless  tobacco. He reports that he drinks alcohol. He reports that he does not use drugs.    Review of Systems          LIPIDS: Under control with Lipitor 40 mg.  Has history of high triglycerides  Although his LDL particle number was below 1000 he has relatively higher small LDL particles and small LDL size       Lab Results  Component Value Date   CHOL 126 04/26/2017   HDL 37.30 (L) 04/26/2017   LDLCALC 61 04/26/2017   LDLDIRECT 71.0 04/28/2016   TRIG 137.0 04/26/2017   CHOLHDL 3 04/26/2017          The blood pressure has been Treated with taking low dose losartan and metoprolol, does follow up with his cardiologist annually    BP Readings from Last 3 Encounters:  09/13/17 140/76  05/24/17 117/73  05/24/17 126/70   He has had numbness in his feet from neuropathy, not having any pain or paresthesiae   LABS:  Lab on 09/10/2017  Component Date Value Ref Range Status  . Hgb A1c MFr Bld 09/10/2017 6.9* 4.6 - 6.5 % Final  Glycemic Control Guidelines for People with Diabetes:Non Diabetic:  <6%Goal of Therapy: <7%Additional Action Suggested:  >8%   . Sodium 09/10/2017 140  135 - 145 mEq/L Final  . Potassium 09/10/2017 4.1  3.5 - 5.1 mEq/L Final  . Chloride 09/10/2017 105  96 - 112 mEq/L Final  . CO2 09/10/2017 27  19 - 32 mEq/L Final  . Glucose, Bld 09/10/2017 91  70 - 99 mg/dL Final  . BUN 09/10/2017 12  6 - 23 mg/dL Final  . Creatinine, Ser 09/10/2017 0.73  0.40 - 1.50 mg/dL Final  . Calcium 09/10/2017 9.2  8.4 - 10.5 mg/dL Final  . GFR 09/10/2017 115.28  >60.00 mL/min Final    Physical Examination:  BP 140/76 (Cuff Size: Normal)   Pulse 72   Ht 6\' 1"  (1.854 m)   Wt (!) 321 lb 6.4 oz (145.8 kg)   SpO2 95%   BMI 42.40 kg/m   He has 2+ left ankle edema and 1+ right ankle edema  Diabetic Foot Exam - Simple   Simple Foot Form Diabetic Foot exam was performed with the following findings:  Yes   Visual Inspection No deformities, no ulcerations, no other skin breakdown  bilaterally:  Yes Sensation Testing See comments:  Yes Pulse Check Posterior Tibialis and Dorsalis pulse intact bilaterally:  Yes Comments Significantly decreased monofilament sensation distally on his toes and absent on the right distal plantar surface        ASSESSMENT:  Diabetes type 2, uncontrolled with  obesity and insulin resistant See history of present illness for detailed discussion of his current management, blood sugar patterns and problems identified     His A1c is 6.9, in the past has been 6% of below Recently not checking blood sugars much because of lack of test strip supplies and difficult to know what his patterns are He can again do better with diet and exercise regimen and does not appear any more motivated on this visit However still probably benefiting from continuing Invokana  NEUROPATHY: No worsening of symptoms recently, does have sensory loss as before  HYPERLIPIDEMIA: Has diabetic dyslipidemia and needs weight loss, last LDL 61  He refuses to have the influenza vaccine   PLAN:  Discussed day-to-day management, also need for weight loss Currently not change his pump settings since has minimal blood sugar data available He does need to improve his diet as he tends to have hyperglycemia especially overnight when he is not  consistent with diet  Hypertension: He will continue follow-up with PCP and cardiologist Also sugar checked periodically at home  NEUROPATHY: Needs to check feet daily and avoid going barefoot  Counseling time on subjects discussed in assessment and plan sections is over 50% of today's 25 minute visit   Freddi Forster 09/13/2017, 11:19 AM   Note: This office note was prepared with Estate agent. Any transcriptional errors that result from this process are unintentional.

## 2017-09-18 IMAGING — US US ABDOMEN COMPLETE
1 series · 13 of 25 positions shown · non-contrast
Comparison: None.

CLINICAL DATA: Chronic right upper quadrant abdominal pain.

EXAM:
ABDOMEN ULTRASOUND COMPLETE

[Series 1: us abdomen complete · 0.28mm/px · 13 of 76 slices shown]
[im 1/76]
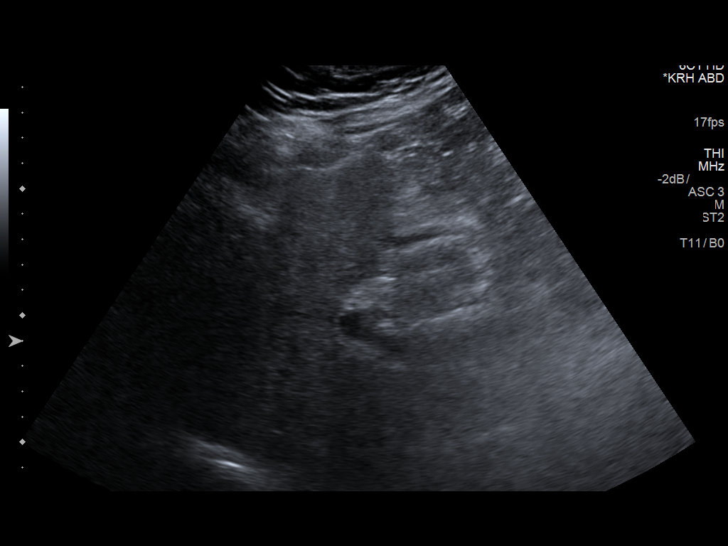
[im 7/76]
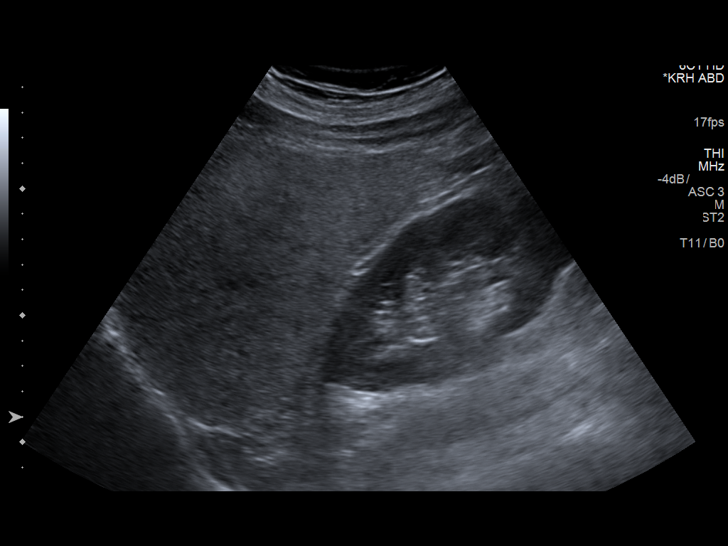
[im 13/76]
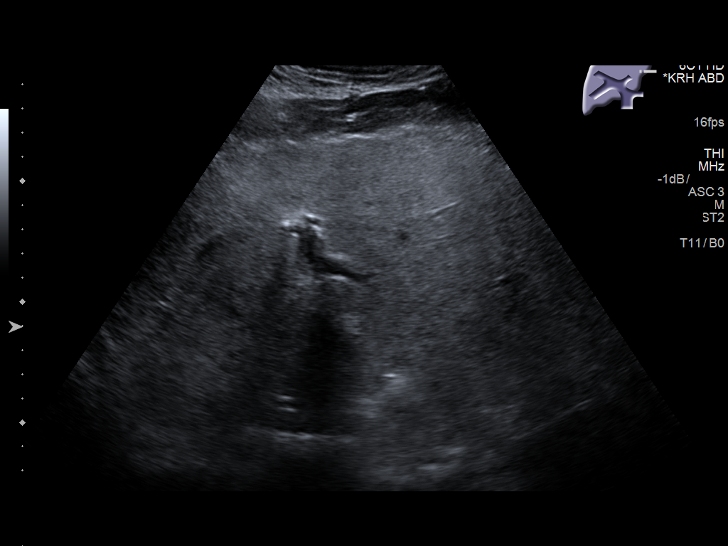
[im 19/76]
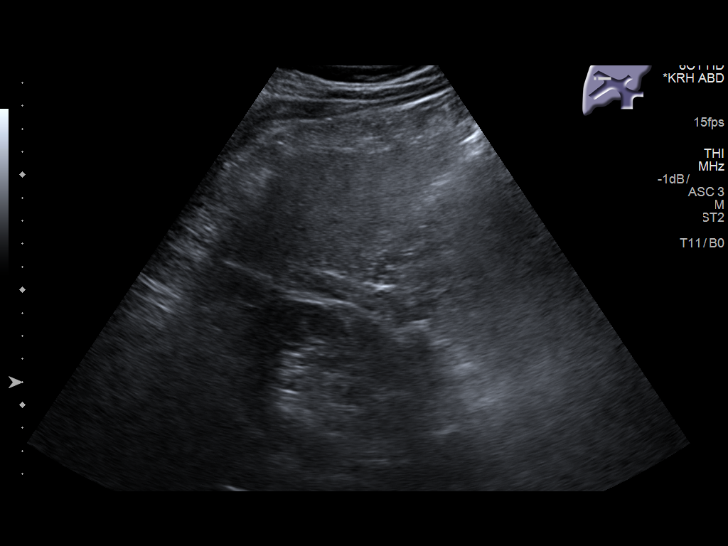
[im 26/76]
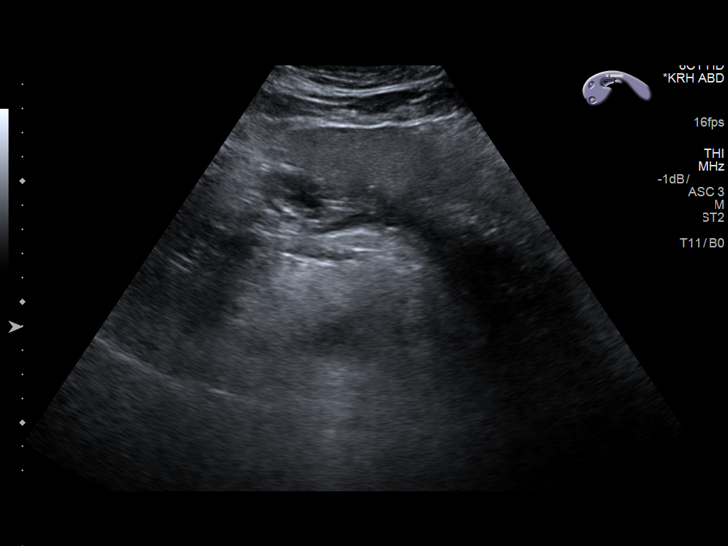
[im 32/76]
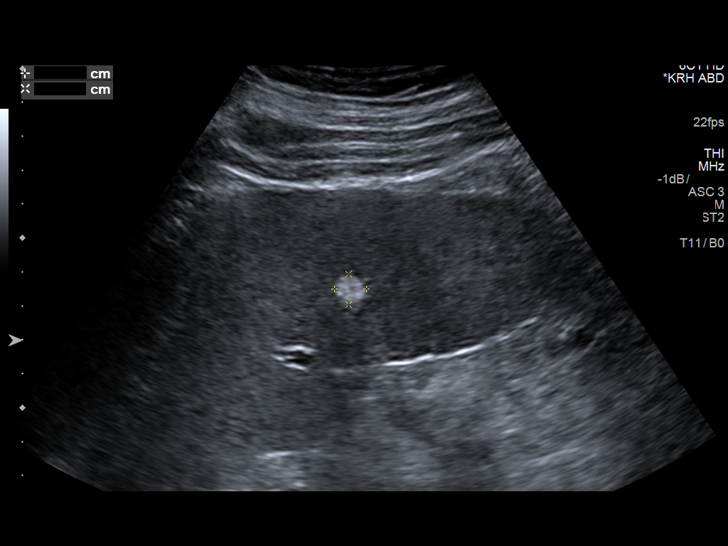
[im 38/76]
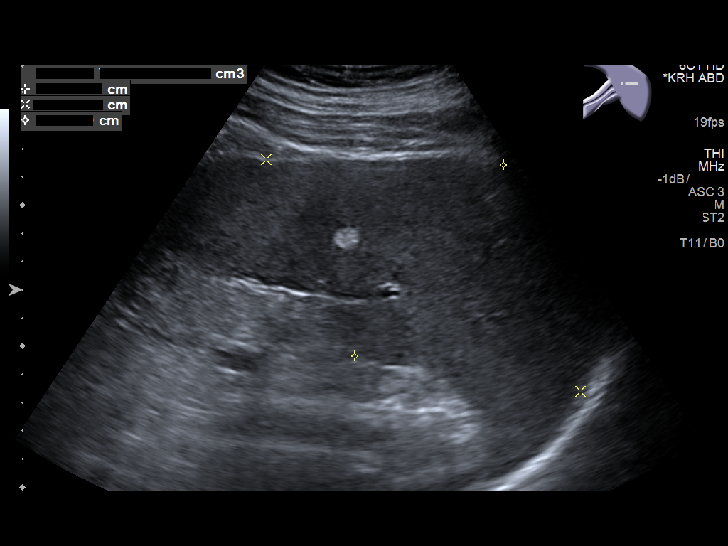
[im 44/76]
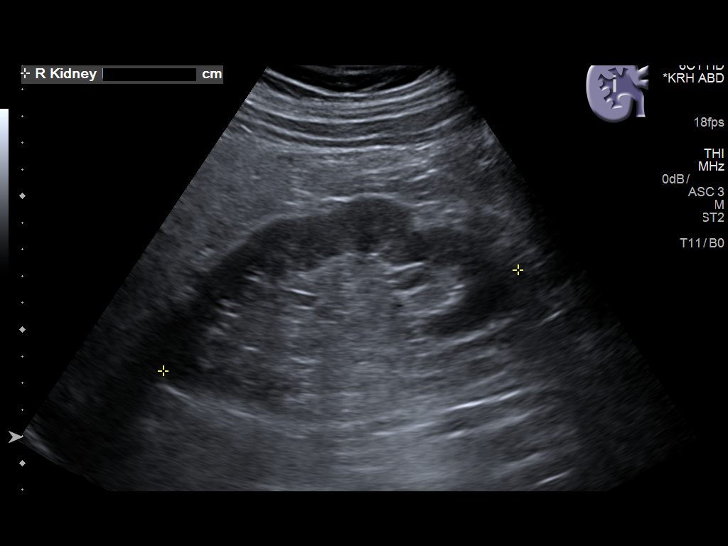
[im 51/76]
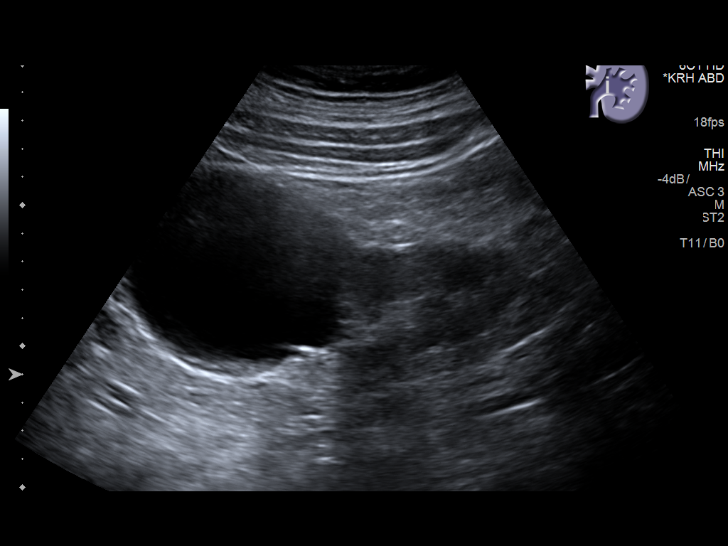
[im 57/76]
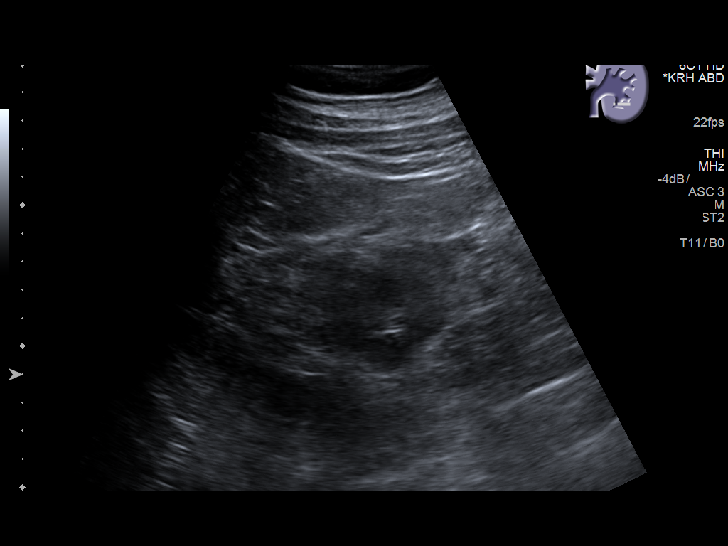
[im 63/76]
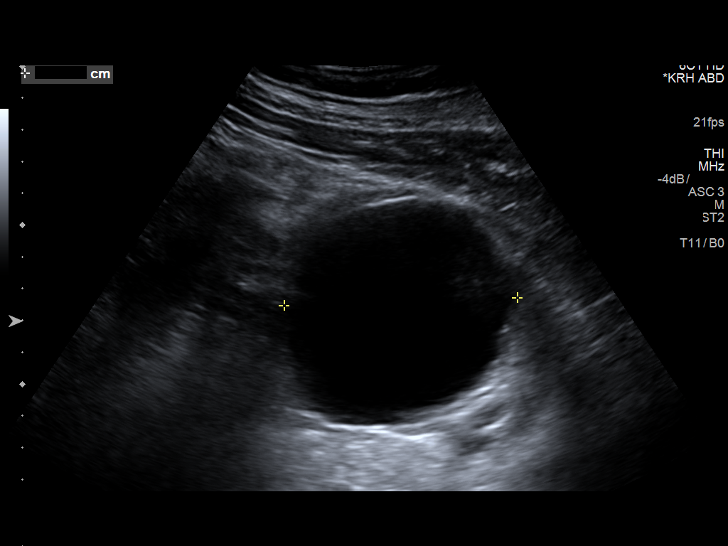
[im 69/76]
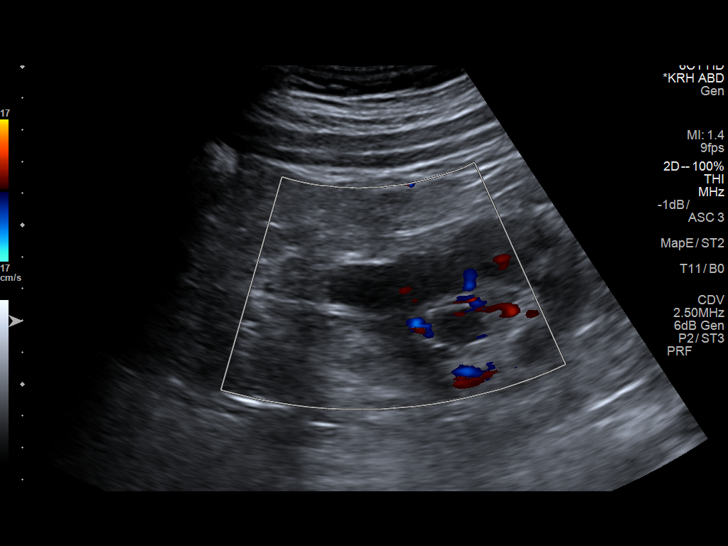
[im 76/76]
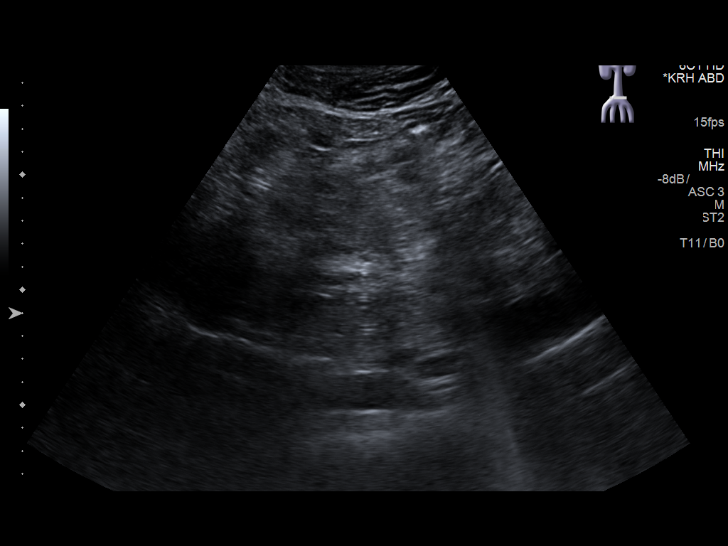

[13 of 25 positions shown; findings below may reference images not displayed]

FINDINGS: Gallbladder: Status post cholecystectomy.

Common bile duct: Diameter: 2 mm which is within normal limits.

Liver: No focal lesion identified. Increased echogenicity of hepatic
parenchyma is noted suggesting fatty infiltration.

IVC: Visualized portion appears normal.

Pancreas: Visualized portion unremarkable.

Spleen: Maximum measured diameter of 13.2 cm consistent with mild
splenomegaly. 1 cm hyperechoic focus is noted most consistent with
hemangioma.

Right Kidney: Length: 13.8 cm. Echogenicity within normal limits. No
mass or hydronephrosis visualized.

Left Kidney: Length: 14.5 cm. Multiple cysts are noted with the
largest measuring 8.3 cm arising from upper pole. Echogenicity
within normal limits. No mass or hydronephrosis visualized.

Abdominal aorta: No aneurysm visualized.

Other findings: None.
IMPRESSION: Status post cholecystectomy.

Increased echogenicity of hepatic parenchyma is noted suggesting
fatty infiltration.

Mild splenomegaly is noted with probable 1 cm hemangioma.

Left renal cysts.

No other abnormality seen in the abdomen.

## 2017-09-18 NOTE — Telephone Encounter (Signed)
Completed.

## 2017-09-24 ENCOUNTER — Other Ambulatory Visit: Payer: Self-pay | Admitting: Cardiovascular Disease

## 2017-10-09 ENCOUNTER — Other Ambulatory Visit: Payer: Self-pay | Admitting: Family Medicine

## 2017-11-21 ENCOUNTER — Other Ambulatory Visit: Payer: Self-pay | Admitting: Family Medicine

## 2017-12-20 DIAGNOSIS — E1165 Type 2 diabetes mellitus with hyperglycemia: Secondary | ICD-10-CM | POA: Diagnosis not present

## 2017-12-20 DIAGNOSIS — E118 Type 2 diabetes mellitus with unspecified complications: Secondary | ICD-10-CM | POA: Diagnosis not present

## 2017-12-25 ENCOUNTER — Other Ambulatory Visit: Payer: Self-pay | Admitting: Family Medicine

## 2018-02-19 ENCOUNTER — Other Ambulatory Visit: Payer: Self-pay | Admitting: Endocrinology

## 2018-02-25 DIAGNOSIS — Z794 Long term (current) use of insulin: Secondary | ICD-10-CM | POA: Diagnosis not present

## 2018-02-25 DIAGNOSIS — G4733 Obstructive sleep apnea (adult) (pediatric): Secondary | ICD-10-CM | POA: Diagnosis not present

## 2018-02-25 DIAGNOSIS — E118 Type 2 diabetes mellitus with unspecified complications: Secondary | ICD-10-CM | POA: Diagnosis not present

## 2018-02-25 DIAGNOSIS — E1165 Type 2 diabetes mellitus with hyperglycemia: Secondary | ICD-10-CM | POA: Diagnosis not present

## 2018-03-01 ENCOUNTER — Other Ambulatory Visit (INDEPENDENT_AMBULATORY_CARE_PROVIDER_SITE_OTHER): Payer: Medicare HMO

## 2018-03-01 DIAGNOSIS — E1165 Type 2 diabetes mellitus with hyperglycemia: Secondary | ICD-10-CM

## 2018-03-01 DIAGNOSIS — Z794 Long term (current) use of insulin: Secondary | ICD-10-CM

## 2018-03-01 LAB — COMPREHENSIVE METABOLIC PANEL
ALT: 30 U/L (ref 0–53)
AST: 32 U/L (ref 0–37)
Albumin: 4.1 g/dL (ref 3.5–5.2)
Alkaline Phosphatase: 46 U/L (ref 39–117)
BILIRUBIN TOTAL: 0.9 mg/dL (ref 0.2–1.2)
BUN: 9 mg/dL (ref 6–23)
CALCIUM: 9 mg/dL (ref 8.4–10.5)
CHLORIDE: 106 meq/L (ref 96–112)
CO2: 29 meq/L (ref 19–32)
CREATININE: 0.91 mg/dL (ref 0.40–1.50)
GFR: 89.26 mL/min (ref >60.00)
GLUCOSE: 105 mg/dL — AB (ref 70–99)
Potassium: 4.4 mEq/L (ref 3.5–5.1)
SODIUM: 141 meq/L (ref 135–145)
Total Protein: 6.8 g/dL (ref 6.0–8.3)

## 2018-03-01 LAB — HEMOGLOBIN A1C: Hgb A1c MFr Bld: 7.1 % — ABNORMAL HIGH (ref 4.6–6.5)

## 2018-03-03 NOTE — Progress Notes (Signed)
Patient ID: Jake Ortiz, male   DOB: 1956/03/19, 62 y.o.   MRN: [id number]           Reason for Appointment: Follow-up for Type 2 Diabetes    History of Present Illness:          Diagnosis: Type 2 diabetes mellitus, date of diagnosis: 1996       Past history:  He was initially treated with metformin at diagnosis in about 5 years later he was started on insulin He had been on Levemir and possibly other insulin regimens before going on an insulin pump in 2005 Apparently was put on insulin pump because of his large insulin requirement and poor control Also has been continued on metformin He  had fairly good A1c results in the past year although previously ranging from 7-7.9; in 07/2014 Was 6.1 He has been on [facility]  since 3/16 which has helped his control , reduce insulin requirement and enable weight loss  Recent history:   He is on a Medtronic 630 insulin pump using a regimen of basal only with the pump and bolusing with Humalog using a syringe.  BASAL RATES: Midnight = 7.5, 4 AM = 8.5, 9 AM = 4.5, 1 PM = 4.5 and 10 PM = 6.0,   Total basal rate about 143 units per day  BOLUSES: Carbohydrate coverage 1:1-1:2 with Humalog with a syringe, correction 1:10 with target 90-110  His A1c has been below 7% previously but now is higher at 7.1  Current blood sugar patterns and management:  He apparently had higher sugars of about 200 especially in the mornings 1-2 months ago but he was not sure why, this was when he was in [place]  He had been otherwise doing the same with his diet and not running out of medications such as Invokana  He increased his overnight basal rates on his own  With this is overall blood sugars are improved although still relatively high on an average in the morning  Blood sugar may be a relatively higher at suppertime but not consistently and he is not having any high readings later at night also  He tries to check his sugars before each meal and at  bedtime at least  HYPOGLYCEMIA has been minimal with one reading in the 60s in the morning and once at bedtime  He is still using the contour meter although does not always have this with him, his previous contour meter is with the old pump and do not connect to the new pump  He is changing his infusion site and cartridge every 2 days  Overall he thinks he is still trying to watch his portions and carbohydrates but is not able to lose weight   Hypoglycemia:  as above      Oral hypoglycemic drugs the patient is taking are: Metformin 1 g twice a day, Invokana 300 mg daily      Compliance with the medical regimen:  Variable recently    Glucose monitoring:  done  4 times a day         Glucometer: Contour    Blood Glucose readings by time of day and averages from pump download:   Mean values apply above for all meters except median for One Touch  PRE-MEAL Fasting Lunch Dinner Bedtime Overall  Glucose range:  67-184  98-142  120-158  62-166   Mean/median:     129    Self-care: The diet that the patient has been following is:  tries to limit high-fat meals   Meals: 3 meals per day. Breakfast is eggs and toast 9 am, has relatively more carbohydrates at lunch            Exercise: No consistent walking or exercise  Dietician visit, most recent: None for a few years.               Weight history:  Wt Readings from Last 3 Encounters:  03/04/18 (!) 322 lb 9.6 oz (146.3 kg)  09/13/17 (!) 321 lb 6.4 oz (145.8 kg)  05/24/17 (!) 321 lb (145.6 kg)    Glycemic control:     Lab Results  Component Value Date   HGBA1C 7.1 (H) 03/01/2018   HGBA1C 6.9 (H) 09/10/2017   HGBA1C 6.7 (H) 05/11/2017   Lab Results  Component Value Date   MICROALBUR <0.7 05/11/2017   LDLCALC 61 04/26/2017   CREATININE 0.91 03/01/2018    Other active problems: See review of systems   Allergies as of 03/04/2018      Reactions   Pollen Extract       Medication List        Accurate as of 03/04/18  1:07  PM. Always use your most recent med list.          aspirin EC 81 MG tablet Take 1 tablet (81 mg total) by mouth daily.   atorvastatin 40 MG tablet Commonly known as:  LIPITOR TAKE 1 TABLET DAILY   cyclobenzaprine 10 MG tablet Commonly known as:  FLEXERIL Take 1 tablet (10 mg total) by mouth 2 (two) times daily as needed. For back pain   FISH OIL PO Take 500 mg by mouth 3 (three) times daily.   fluticasone 50 MCG/ACT nasal spray Commonly known as:  FLONASE USE 2 SPRAYS IN EACH NOSTRIL DAILY   glucose blood test strip Commonly known as:  BAYER CONTOUR NEXT TEST USE AS DIRECTED 4-6 TIMES A DAY TO CHECK BLOOD SUGAR   glucose blood test strip Commonly known as:  BAYER CONTOUR NEXT TEST USE AS DIRECTED 4-6 TIMES A DAY TO CHECK BLOOD SUGAR   glucose blood test strip Commonly known as:  BAYER CONTOUR NEXT TEST USE AS DIRECTED 4-6 TIMES A DAY TO CHECK BLOOD SUGAR   HUMALOG 100 UNIT/ML injection Generic drug:  insulin lispro INJECT VIA INSULIN PUMP PER SLIDING SCALE   HYDROcodone-acetaminophen 10-325 MG tablet Commonly known as:  NORCO Take 1 tablet by mouth every 6 (six) hours as needed.   INVOKANA 300 MG Tabs tablet Generic drug:  canagliflozin TAKE 1 TABLET DAILY BEFORE BREAKFAST   losartan 25 MG tablet Commonly known as:  COZAAR TAKE 1 TABLET DAILY   metFORMIN 1000 MG tablet Commonly known as:  GLUCOPHAGE TAKE 2 TABLETS DAILY   metoprolol succinate 50 MG 24 hr tablet Commonly known as:  TOPROL-XL TAKE 1 TABLET DAILY   VITAMIN D-3 PO Take 1,000 Int'l Units by mouth daily.       Allergies:  Allergies  Allergen Reactions  . Pollen Extract     Past Medical History:  Diagnosis Date  . ALLERGIC RHINITIS, SEASONAL 01/25/2011  . BACK STRAIN, LUMBAR 01/25/2011  . Chronic pain syndrome   . Chronic radicular low back pain 01/25/2011   Qualifier: Diagnosis of  By: Charlett Blake MD, Stacey  1. Focal annular tear of the L4-5 disc at the level of the right  neural foramen  with a tiny disc protrusion compressing the right L4  nerve in the neural foramen. This  is more prominent than on the  prior study.  2. 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  Follows with Dr Genevie Ann [facility]  . COLONIC POLYPS, BENIGN 01/25/2011  . Cough 11/10/2013  . Degeneration of lumbar or lumbosacral intervertebral disc   . Diabetes mellitus   . DIVERTICULOSIS OF COLON 01/25/2011  . DM 01/25/2011  . Hypocalcemia 03/04/2012  . 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 LAE (left atrial enlargement) 06/27/2013  . Mixed hyperlipidemia 01/25/2011  . Morbid obesity ([place]) 01/25/2011  . Pain in right axilla 11/10/2013  . Personal history of colonic polyps- sessile serrated adenoma 12/21/2006  . Preventative health care 03/04/2012  . RBBB 01/25/2011   Qualifier: Diagnosis of  By: Charlett Blake MD, Erline Levine    . Renal cyst 05/07/2016  . SLEEP APNEA 01/25/2011  . Tinea pedis 11/15/2014  . Undiagnosed cardiac murmurs 01/25/2011    Past Surgical History:  Procedure Laterality Date  . CHOLECYSTECTOMY    . skin biopsy on abdomen     unknown results  . TONSILLECTOMY      Family History  Problem Relation Age of Onset  . Cancer Mother        metastatic breast ca  . Allergies Mother   . Diabetes Father   . Other Father        renal failure/ CHF  . Coronary artery disease Father        s/p MI  . Diabetes Sister        Type 1  . Coronary artery disease Sister        s/p stents  . Heart disease Sister   . Obesity Brother   . Other Brother        Sport and exercise psychologist  . Asthma Daughter   . Other Daughter        back disease  . Diabetes Daughter   . Coronary artery disease Maternal Grandmother   . Diabetes Brother   . Heart disease Brother        s/p CABG  . Diabetes Brother        diet and oral meds  . Hypertension Brother   . Kidney disease Daughter        in childhood  . Colon cancer Neg Hx   . Pancreatic cancer  Neg Hx   . Stomach cancer Neg Hx     Social History:  reports that he quit smoking about 27 years ago. He has a 30.00 pack-year smoking history. He has never used smokeless tobacco. He reports that he drinks alcohol. He reports that he does not use drugs.    Review of Systems          LIPIDS: LDL showing control with Lipitor 40 mg.   Has history of high triglycerides and low HDL  Although his LDL particle number previously was below 1000 he has relatively higher small LDL particles and small LDL size       Lab Results  Component Value Date   CHOL 126 04/26/2017   HDL 37.30 (L) 04/26/2017   LDLCALC 61 04/26/2017   LDLDIRECT 71.0 04/28/2016   TRIG 137.0 04/26/2017   CHOLHDL 3 04/26/2017          The blood pressure has been Treated with taking low dose losartan and metoprolol, has follow up with his cardiologist annually    BP Readings from Last 3 Encounters:  03/04/18 130/80  09/13/17  140/76  05/24/17 117/73   He has had mild numbness in his feet from neuropathy, not having any pain or paresthesiae   LABS:  Lab on 03/01/2018  Component Date Value Ref Range Status  . Sodium 03/01/2018 141  135 - 145 mEq/L Final  . Potassium 03/01/2018 4.4  3.5 - 5.1 mEq/L Final  . Chloride 03/01/2018 106  96 - 112 mEq/L Final  . CO2 03/01/2018 29  19 - 32 mEq/L Final  . Glucose, Bld 03/01/2018 105* 70 - 99 mg/dL Final  . BUN 03/01/2018 9  6 - 23 mg/dL Final  . Creatinine, Ser 03/01/2018 0.91  0.40 - 1.50 mg/dL Final  . Total Bilirubin 03/01/2018 0.9  0.2 - 1.2 mg/dL Final  . Alkaline Phosphatase 03/01/2018 46  39 - 117 U/L Final  . AST 03/01/2018 32  0 - 37 U/L Final  . ALT 03/01/2018 30  0 - 53 U/L Final  . Total Protein 03/01/2018 6.8  6.0 - 8.3 g/dL Final  . Albumin 03/01/2018 4.1  3.5 - 5.2 g/dL Final  . Calcium 03/01/2018 9.0  8.4 - 10.5 mg/dL Final  . GFR 03/01/2018 89.26  >60.00 mL/min Final  . Hgb A1c MFr Bld 03/01/2018 7.1* 4.6 - 6.5 % Final   Glycemic Control Guidelines  for People with Diabetes:Non Diabetic:  <6%Goal of Therapy: <7%Additional Action Suggested:  >8%     Physical Examination:  BP 130/80 (BP Location: Right Arm, Patient Position: Sitting, Cuff Size: Large)   Pulse 79   Ht 6\' 1"  (1.854 m)   Wt (!) 322 lb 9.6 oz (146.3 kg)   SpO2 97%   BMI 42.56 kg/m       ASSESSMENT:  Diabetes type 2, uncontrolled with  obesity and insulin resistant  See history of present illness for detailed discussion of his current management, blood sugar patterns and problems identified     His A1c is 7.1 and higher possibly because of increased blood sugars in the last 3 months and insulin requirement appears to have increased This is more related to his fasting hyperglycemia by history  He is already taking 150 units also of basal insulin and probably 100 units in bolus insulin With his limited capacity for the insulin pump he is still not able to bolus using the pump and is using manual injections  With his insulin resistant state he should benefit from U-500 insulin Discussed in detail how this would be different with using in the pump, requiring about 1/5 of the amounts both in basal and bolus and possibly providing better control consistently He will of course have to bolus 30 minutes before eating He is wanting to consider this and not switch right now and also will check on the cost  He is also a candidate for a GLP-1 drug but he does not want to add another medication because of cost  NEUROPATHY: Has a history of sensory loss, needs to check feet daily  HYPERLIPIDEMIA: Has diabetic dyslipidemia and will check periodically     PLAN:  He will look into the FreeStyle libre sensor Discussed in detail how this would work and would give him a better idea of his blood sugar patterns and he will have more convenient since checking blood sugars even when he is not at home He does need to check with the insurance about coverage Also as discussed above he  should look into the U-500 insulin for his pump Meanwhile he will continue his settings unchanged  Again  reminded him to start exercise for weight loss  Counseling time on subjects discussed in assessment and plan sections is over 50% of today's 25 minute visit    Elayne Snare 03/04/2018, 1:07 PM   Note: This office note was prepared with Dragon voice recognition system technology. Any transcriptional errors that result from this process are unintentional.

## 2018-03-04 ENCOUNTER — Ambulatory Visit (INDEPENDENT_AMBULATORY_CARE_PROVIDER_SITE_OTHER): Payer: Medicare HMO | Admitting: Endocrinology

## 2018-03-04 ENCOUNTER — Encounter: Payer: Self-pay | Admitting: Endocrinology

## 2018-03-04 VITALS — BP 130/80 | HR 79 | Ht 73.0 in | Wt 322.6 lb

## 2018-03-04 DIAGNOSIS — I10 Essential (primary) hypertension: Secondary | ICD-10-CM | POA: Diagnosis not present

## 2018-03-04 DIAGNOSIS — E782 Mixed hyperlipidemia: Secondary | ICD-10-CM | POA: Diagnosis not present

## 2018-03-04 DIAGNOSIS — E1165 Type 2 diabetes mellitus with hyperglycemia: Secondary | ICD-10-CM

## 2018-03-04 DIAGNOSIS — Z794 Long term (current) use of insulin: Secondary | ICD-10-CM

## 2018-03-04 NOTE — Patient Instructions (Signed)
Freestyle [place] sensor '

## 2018-03-14 ENCOUNTER — Telehealth: Payer: Self-pay | Admitting: *Deleted

## 2018-03-14 NOTE — Telephone Encounter (Signed)
Rec fax stating medicare will not cover Contour Next strips for 4-6 x/day testing. Can I send a new Rx for 3-4 x/day testing?

## 2018-03-14 NOTE — Telephone Encounter (Signed)
Since he is on an insulin pump he needs to continue testing as before, will need to check with pharmacy

## 2018-03-15 NOTE — Telephone Encounter (Signed)
I was unable to initiate PA via [facility]. I submitted both his [place] Medicare member ID and his Rx plan ID numbers.  Rx ID: [id number] [employer] Medicare ID: [id number]  The patient's Rx plan/part D does not cover any testing supplies.  I called [employer] part B plan @ [phone].I spoke to a representative and she is faxing me a form for completion.

## 2018-03-18 ENCOUNTER — Telehealth: Payer: Self-pay

## 2018-03-18 NOTE — Telephone Encounter (Signed)
Received fax from  [facility] stating that they authorize and approve Contour Next Test Strips for diabetic testing.

## 2018-03-20 DIAGNOSIS — G4733 Obstructive sleep apnea (adult) (pediatric): Secondary | ICD-10-CM | POA: Diagnosis not present

## 2018-03-25 DIAGNOSIS — R69 Illness, unspecified: Secondary | ICD-10-CM | POA: Diagnosis not present

## 2018-03-28 ENCOUNTER — Encounter: Payer: Self-pay | Admitting: Cardiology

## 2018-03-28 ENCOUNTER — Ambulatory Visit: Payer: Medicare HMO | Admitting: Cardiology

## 2018-03-28 VITALS — BP 144/86 | HR 75 | Ht 73.0 in | Wt 320.0 lb

## 2018-03-28 DIAGNOSIS — G4733 Obstructive sleep apnea (adult) (pediatric): Secondary | ICD-10-CM

## 2018-03-28 DIAGNOSIS — I471 Supraventricular tachycardia: Secondary | ICD-10-CM | POA: Diagnosis not present

## 2018-03-28 DIAGNOSIS — I517 Cardiomegaly: Secondary | ICD-10-CM

## 2018-03-28 DIAGNOSIS — E1169 Type 2 diabetes mellitus with other specified complication: Secondary | ICD-10-CM

## 2018-03-28 DIAGNOSIS — I10 Essential (primary) hypertension: Secondary | ICD-10-CM | POA: Diagnosis not present

## 2018-03-28 DIAGNOSIS — E669 Obesity, unspecified: Secondary | ICD-10-CM | POA: Diagnosis not present

## 2018-03-28 NOTE — Patient Instructions (Addendum)
Medication Instructions: Your physician recommends that you continue on your current medications as directed. Please refer to the Current Medication list given to you today.   Labwork: None Ordred  Procedures/Testing: None Ordered  Follow-Up: Your physician wants you to follow-up in: 1 year with Pecolia Ades NP [place] PA-C You will receive a reminder letter in the mail two months in advance. If you don't receive a letter, please call our office to schedule the follow-up appointment.   Any Additional Special Instructions Will Be Listed Below (If Applicable).     If you need a refill on your cardiac medications before your next appointment, please call your pharmacy.

## 2018-03-28 NOTE — Progress Notes (Signed)
Cardiology Office Note:    Date:  03/28/2018   ID:  Jake Ortiz, DOB 20-Nov-1956, MRN [id number]  PCP:  Jake Lukes, MD  Cardiologist:  Jake Moores, MD  Referring MD: Jake Lukes, MD   Chief Complaint  Patient presents with  . Follow-up    SVT    History of Present Illness:    Jake Ortiz is a 62 y.o. male with a past medical history significant for diabetes type 2 since 1996, SVT, obesity, OSA on CPAP, hyperlipidemia.  SVT has been controlled on beta-blocker.  He is followed annually with his last appointment on 05/11/2017 with Jake Dopp, PA.  He had a normal echocardiogram in 2016.  Mr. Jake Ortiz is here today for follow-up alone. He is not doing any exercise. He walks short distances in his daily activities. 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 They travel all year. He denies any palpitations or SVT symptoms. He also denies exertional chest discomfort or dyspnea and  no lightheadedness, orthopnea, PND. He has chronic edema related to venous insufficiency, unchanged. He uses CPAP regularly. He is tolerating the metoprolol well.    Past Medical History:  Diagnosis Date  . ALLERGIC RHINITIS, SEASONAL 01/25/2011  . BACK STRAIN, LUMBAR 01/25/2011  . Chronic pain syndrome   . Chronic radicular low back pain 01/25/2011   Qualifier: Diagnosis of  By: Jake Blake MD, Stacey  1. Focal annular tear of the L4-5 disc at the level of the right  neural foramen with a tiny disc protrusion compressing the right L4  nerve in the neural foramen. This is more prominent than on the  prior study.  2. 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  Follows with Dr [facility]  . COLONIC POLYPS, BENIGN 01/25/2011  . Cough 11/10/2013  . Degeneration of lumbar or lumbosacral intervertebral disc   . Diabetes mellitus   . DIVERTICULOSIS OF COLON 01/25/2011  . DM 01/25/2011  . Hypocalcemia 03/04/2012  .  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 LAE (left atrial enlargement) 06/27/2013  . Mixed hyperlipidemia 01/25/2011  . Morbid obesity ([place]) 01/25/2011  . Pain in right axilla 11/10/2013  . Personal history of colonic polyps- sessile serrated adenoma 12/21/2006  . Preventative health care 03/04/2012  . RBBB 01/25/2011   Qualifier: Diagnosis of  By: Jake Blake MD, Jake Ortiz    . Renal cyst 05/07/2016  . SLEEP APNEA 01/25/2011  . Tinea pedis 11/15/2014  . Undiagnosed cardiac murmurs 01/25/2011    Past Surgical History:  Procedure Laterality Date  . CHOLECYSTECTOMY    . skin biopsy on abdomen     unknown results  . TONSILLECTOMY      Current Medications: Current Meds  Medication Sig  . aspirin EC 81 MG tablet Take 1 tablet (81 mg total) by mouth daily.  Marland Kitchen atorvastatin (LIPITOR) 40 MG tablet TAKE 1 TABLET DAILY  . Cholecalciferol (VITAMIN D-3 PO) Take 1,000 Int'l Units by mouth daily.   . cyclobenzaprine (FLEXERIL) 10 MG tablet Take 1 tablet (10 mg total) by mouth 2 (two) times daily as needed. For back pain  . fluticasone (FLONASE) 50 MCG/ACT nasal spray USE 2 SPRAYS IN EACH NOSTRIL DAILY  . glucose blood (BAYER CONTOUR NEXT TEST) test strip USE AS DIRECTED 4-6 TIMES A DAY TO CHECK BLOOD SUGAR  . HUMALOG 100 UNIT/ML injection INJECT VIA INSULIN  PUMP PER SLIDING SCALE  . HYDROcodone-acetaminophen (NORCO) 10-325 MG per tablet Take 1 tablet by mouth every 6 (six) hours as needed.   . INVOKANA 300 MG TABS tablet TAKE 1 TABLET DAILY BEFORE BREAKFAST  . losartan (COZAAR) 25 MG tablet TAKE 1 TABLET DAILY  . metFORMIN (GLUCOPHAGE) 1000 MG tablet TAKE 2 TABLETS DAILY  . metoprolol succinate (TOPROL-XL) 50 MG 24 hr tablet TAKE 1 TABLET DAILY  . Omega-3 Fatty Acids (FISH OIL PO) Take 500 mg by mouth 3 (three) times daily.      Allergies:   Pollen extract   Social History   Socioeconomic History  . Marital status: Married    Spouse name: Not on file  . Number of children: 4    . Years of education: Not on file  . Highest education level: Not on file  Occupational History  . Occupation: retired  Scientific laboratory technician  . Financial resource strain: Not on file  . Food insecurity:    Worry: Not on file    Inability: Not on file  . Transportation needs:    Medical: Not on file    Non-medical: Not on file  Tobacco Use  . Smoking status: Former Smoker    Packs/day: 3.00    Years: 10.00    Pack years: 30.00    Last attempt to quit: 11/20/1990    Years since quitting: 27.3  . Smokeless tobacco: Never Used  Substance and Sexual Activity  . Alcohol use: Yes    Comment: occasional  . Drug use: No  . Sexual activity: Yes  Lifestyle  . Physical activity:    Days per week: Not on file    Minutes per session: Not on file  . Stress: Not on file  Relationships  . Social connections:    Talks on phone: Not on file    Gets together: Not on file    Attends religious service: Not on file    Active member of club or organization: Not on file    Attends meetings of clubs or organizations: Not on file    Relationship status: Not on file  Other Topics Concern  . Not on file  Social History Narrative  . Not on file     Family History: The patient's family history includes Allergies in his mother; Asthma in his daughter; Cancer in his mother; Coronary artery disease in his father, maternal grandmother, and sister; Diabetes in his brother, brother, daughter, father, and sister; Heart disease in his brother and sister; Hypertension in his brother; Kidney disease in his daughter; Obesity in his brother; Other in his brother, daughter, and father. There is no history of Colon cancer, Pancreatic cancer, or Stomach cancer. ROS:   Please see the history of present illness.     All other systems reviewed and are negative.  EKGs/Labs/Other Studies Reviewed:    The following studies were reviewed today:  He had a normal echocardiogram in 2016 Study Conclusions  - Left ventricle:  The cavity size was normal. Wall thickness was normal. Systolic function was normal. The estimated ejection fraction was in the range of 60% to 65%. Wall motion was normal; there were no regional wall motion abnormalities. Doppler parameters are consistent with abnormal left ventricular relaxation (grade 1 diastolic dysfunction). - Left atrium: The atrium was mildly dilated. - Right atrium: The atrium was mildly dilated.  EKG:  EKG is ordered today.  The ekg ordered today demonstrates NSR, Incomplete RBBB, LAFB, no significant change from previous.  Recent Labs: 04/26/2017: Hemoglobin 13.5; Platelets 134.0; TSH 1.18 03/01/2018: ALT 30; BUN 9; Creatinine, Ser 0.91; Potassium 4.4; Sodium 141   Recent Lipid Panel    Component Value Date/Time   CHOL 126 04/26/2017 0924   TRIG 137.0 04/26/2017 0924   HDL 37.30 (L) 04/26/2017 0924   CHOLHDL 3 04/26/2017 0924   VLDL 27.4 04/26/2017 0924   LDLCALC 61 04/26/2017 0924   LDLDIRECT 71.0 04/28/2016 1142    Physical Exam:    VS:  BP (!) 144/86   Pulse 75   Ht 6\' 1"  (1.854 m)   Wt (!) 320 lb (145.2 kg)   SpO2 94%   BMI 42.22 kg/m     Wt Readings from Last 3 Encounters:  03/28/18 (!) 320 lb (145.2 kg)  03/04/18 (!) 322 lb 9.6 oz (146.3 kg)  09/13/17 (!) 321 lb 6.4 oz (145.8 kg)     Physical Exam  Constitutional: He is oriented to person, place, and time. He appears well-developed and well-nourished.  HENT:  Head: Normocephalic and atraumatic.  Neck: Normal range of motion. Neck supple. No JVD present.  Cardiovascular: Normal rate, regular rhythm, normal heart sounds and intact distal pulses. Exam reveals no gallop and no friction rub.  No murmur heard. Pulmonary/Chest: Effort normal and breath sounds normal. No respiratory distress. He has no wheezes. He has no rales.  Abdominal: Soft. Bowel sounds are normal. He exhibits no distension.  Musculoskeletal: Normal range of motion. He exhibits edema.  Bilateral lower leg  edema, L>R, vascular skin changes-dry and bronzed  Neurological: He is alert and oriented to person, place, and time.  Skin: Skin is warm and dry.  Psychiatric: He has a normal mood and affect. His behavior is normal. Thought content normal.  Vitals reviewed.   ASSESSMENT:    1. SVT (supraventricular tachycardia) ([place])   2. Diabetes mellitus type 2 in obese ([place])   3. Essential hypertension   4. Morbid obesity ([place])   5. Obstructive sleep apnea   6. LAE (left atrial enlargement)    PLAN:    In order of problems listed above:  SVT: Well-controlled on beta-blocker.  Continue metoprolol succinate.  Diabetes type 2 on insulin: Patient has been diabetic since 1996 and on insulin pump since 2005.  Is also on metformin and Invokana. Last A1c 7.1.  He would like to get back into the 6 range.  On a stain, LDL 61 in 04/2017. Followed by PCP.   Hypertension: BP is a little evaluated today. Pt says is usually better controlled. He relates elevation today to back pain. Continue current therapy.   OSA on CPAP: using consistently  Obesity: He struggles to loose wt. He tries to eat lots of fruits and vegetables, but slips a lot. We discussed portion control. Also discussed exercise with walking, working up to 30 minutes on most days. This will also have a beneficial effect on glucose transport into the cells with diabetes.    Medication Adjustments/Labs and Tests Ordered: Current medicines are reviewed at length with the patient today.  Concerns regarding medicines are outlined above. Labs and tests ordered and medication changes are outlined in the patient instructions below:  Patient Instructions  Medication Instructions: Your physician recommends that you continue on your current medications as directed. Please refer to the Current Medication list given to you today.   Labwork: None Ordred  Procedures/Testing: None Ordered  Follow-Up: Your physician wants you to follow-up in: 1 year  with Pecolia Ades NP [place]  PA-C You will receive a reminder letter in the mail two months in advance. If you don't receive a letter, please call our office to schedule the follow-up appointment.   Any Additional Special Instructions Will Be Listed Below (If Applicable).     If you need a refill on your cardiac medications before your next appointment, please call your pharmacy.      Signed, Daune Perch, NP  03/28/2018 9:43 AM    [facility]

## 2018-04-08 ENCOUNTER — Other Ambulatory Visit: Payer: Self-pay | Admitting: Endocrinology

## 2018-05-27 DIAGNOSIS — E1165 Type 2 diabetes mellitus with hyperglycemia: Secondary | ICD-10-CM | POA: Diagnosis not present

## 2018-05-27 DIAGNOSIS — Z794 Long term (current) use of insulin: Secondary | ICD-10-CM | POA: Diagnosis not present

## 2018-05-27 DIAGNOSIS — G4733 Obstructive sleep apnea (adult) (pediatric): Secondary | ICD-10-CM | POA: Diagnosis not present

## 2018-05-27 DIAGNOSIS — E118 Type 2 diabetes mellitus with unspecified complications: Secondary | ICD-10-CM | POA: Diagnosis not present

## 2018-06-24 ENCOUNTER — Other Ambulatory Visit: Payer: Self-pay | Admitting: Cardiovascular Disease

## 2018-06-27 DIAGNOSIS — G4733 Obstructive sleep apnea (adult) (pediatric): Secondary | ICD-10-CM | POA: Diagnosis not present

## 2018-07-08 ENCOUNTER — Other Ambulatory Visit: Payer: Self-pay | Admitting: Family Medicine

## 2018-07-29 DIAGNOSIS — I471 Supraventricular tachycardia: Secondary | ICD-10-CM | POA: Diagnosis not present

## 2018-07-29 DIAGNOSIS — M5186 Other intervertebral disc disorders, lumbar region: Secondary | ICD-10-CM | POA: Diagnosis not present

## 2018-07-29 DIAGNOSIS — Z9641 Presence of insulin pump (external) (internal): Secondary | ICD-10-CM | POA: Diagnosis not present

## 2018-07-29 DIAGNOSIS — Z6841 Body Mass Index (BMI) 40.0 and over, adult: Secondary | ICD-10-CM | POA: Diagnosis not present

## 2018-07-29 DIAGNOSIS — G4733 Obstructive sleep apnea (adult) (pediatric): Secondary | ICD-10-CM | POA: Diagnosis not present

## 2018-07-29 DIAGNOSIS — Z0000 Encounter for general adult medical examination without abnormal findings: Secondary | ICD-10-CM | POA: Diagnosis not present

## 2018-07-29 DIAGNOSIS — E785 Hyperlipidemia, unspecified: Secondary | ICD-10-CM | POA: Diagnosis not present

## 2018-07-29 DIAGNOSIS — E119 Type 2 diabetes mellitus without complications: Secondary | ICD-10-CM | POA: Diagnosis not present

## 2018-07-31 DIAGNOSIS — Z0000 Encounter for general adult medical examination without abnormal findings: Secondary | ICD-10-CM | POA: Diagnosis not present

## 2018-07-31 DIAGNOSIS — Z125 Encounter for screening for malignant neoplasm of prostate: Secondary | ICD-10-CM | POA: Diagnosis not present

## 2018-08-18 ENCOUNTER — Other Ambulatory Visit: Payer: Self-pay | Admitting: Endocrinology

## 2018-08-29 DIAGNOSIS — E118 Type 2 diabetes mellitus with unspecified complications: Secondary | ICD-10-CM | POA: Diagnosis not present

## 2018-08-29 DIAGNOSIS — Z794 Long term (current) use of insulin: Secondary | ICD-10-CM | POA: Diagnosis not present

## 2018-08-29 DIAGNOSIS — G4733 Obstructive sleep apnea (adult) (pediatric): Secondary | ICD-10-CM | POA: Diagnosis not present

## 2018-08-29 DIAGNOSIS — E1165 Type 2 diabetes mellitus with hyperglycemia: Secondary | ICD-10-CM | POA: Diagnosis not present

## 2018-09-04 ENCOUNTER — Other Ambulatory Visit: Payer: Medicare HMO

## 2018-09-04 DIAGNOSIS — Z6841 Body Mass Index (BMI) 40.0 and over, adult: Secondary | ICD-10-CM | POA: Diagnosis not present

## 2018-09-04 DIAGNOSIS — I499 Cardiac arrhythmia, unspecified: Secondary | ICD-10-CM | POA: Diagnosis not present

## 2018-09-04 DIAGNOSIS — E785 Hyperlipidemia, unspecified: Secondary | ICD-10-CM | POA: Diagnosis not present

## 2018-09-04 DIAGNOSIS — R9431 Abnormal electrocardiogram [ECG] [EKG]: Secondary | ICD-10-CM | POA: Diagnosis not present

## 2018-09-06 ENCOUNTER — Ambulatory Visit: Payer: Medicare HMO | Admitting: Endocrinology

## 2018-09-06 ENCOUNTER — Other Ambulatory Visit: Payer: Medicare Other

## 2018-09-06 DIAGNOSIS — E785 Hyperlipidemia, unspecified: Secondary | ICD-10-CM | POA: Diagnosis not present

## 2018-09-06 DIAGNOSIS — I10 Essential (primary) hypertension: Secondary | ICD-10-CM | POA: Diagnosis not present

## 2018-09-06 DIAGNOSIS — E109 Type 1 diabetes mellitus without complications: Secondary | ICD-10-CM | POA: Diagnosis not present

## 2018-09-09 ENCOUNTER — Ambulatory Visit: Payer: Medicare Other | Admitting: Endocrinology

## 2018-09-16 ENCOUNTER — Ambulatory Visit: Payer: Medicare HMO | Admitting: Endocrinology

## 2018-09-19 DIAGNOSIS — E119 Type 2 diabetes mellitus without complications: Secondary | ICD-10-CM | POA: Diagnosis not present

## 2018-09-19 DIAGNOSIS — H524 Presbyopia: Secondary | ICD-10-CM | POA: Diagnosis not present

## 2018-09-20 DIAGNOSIS — R69 Illness, unspecified: Secondary | ICD-10-CM | POA: Diagnosis not present

## 2018-09-20 DIAGNOSIS — E1065 Type 1 diabetes mellitus with hyperglycemia: Secondary | ICD-10-CM | POA: Diagnosis not present

## 2018-09-23 DIAGNOSIS — E785 Hyperlipidemia, unspecified: Secondary | ICD-10-CM | POA: Diagnosis not present

## 2018-09-23 DIAGNOSIS — I10 Essential (primary) hypertension: Secondary | ICD-10-CM | POA: Diagnosis not present

## 2018-09-23 DIAGNOSIS — E109 Type 1 diabetes mellitus without complications: Secondary | ICD-10-CM | POA: Diagnosis not present

## 2018-10-02 DIAGNOSIS — G4733 Obstructive sleep apnea (adult) (pediatric): Secondary | ICD-10-CM | POA: Diagnosis not present

## 2018-10-28 DIAGNOSIS — I10 Essential (primary) hypertension: Secondary | ICD-10-CM | POA: Diagnosis not present

## 2018-10-28 DIAGNOSIS — E785 Hyperlipidemia, unspecified: Secondary | ICD-10-CM | POA: Diagnosis not present

## 2018-10-28 DIAGNOSIS — E109 Type 1 diabetes mellitus without complications: Secondary | ICD-10-CM | POA: Diagnosis not present

## 2018-11-07 DIAGNOSIS — R69 Illness, unspecified: Secondary | ICD-10-CM | POA: Diagnosis not present

## 2019-01-22 ENCOUNTER — Encounter: Payer: Self-pay | Admitting: Internal Medicine

## 2019-01-28 ENCOUNTER — Encounter: Payer: Self-pay | Admitting: Internal Medicine

## 2019-02-15 ENCOUNTER — Other Ambulatory Visit: Payer: Self-pay | Admitting: Family Medicine

## 2019-03-21 ENCOUNTER — Other Ambulatory Visit: Payer: Self-pay | Admitting: Cardiovascular Disease

## 2019-04-01 ENCOUNTER — Telehealth: Payer: Self-pay

## 2019-04-01 NOTE — Telephone Encounter (Signed)
Called pt to set up possible evisit with VB 04/03/2019. Left message asking pt to call the office.

## 2019-06-11 ENCOUNTER — Telehealth: Payer: Self-pay

## 2019-06-11 NOTE — Telephone Encounter (Signed)
Thank you for letting me know

## 2019-06-11 NOTE — Telephone Encounter (Signed)
I called pt to schedule his f/u but he stated that due to family issues he has moved to [place]. He said he called to let Dr Acie Fredrickson know but the wait time to speak to someone was terribly long. I told him I would let you know.

## 2019-08-14 ENCOUNTER — Other Ambulatory Visit: Payer: Self-pay | Admitting: Endocrinology

## 2020-02-10 ENCOUNTER — Other Ambulatory Visit: Payer: Self-pay | Admitting: Family Medicine

## 2021-01-13 ENCOUNTER — Other Ambulatory Visit: Payer: Self-pay | Admitting: Family Medicine
